# Patient Record
Sex: Male | Born: 1935 | Race: Black or African American | Hispanic: No | Marital: Married | Smoking: Never smoker
Health system: Southern US, Community
[De-identification: ages and names within clinical notes are randomized; demographics above are authoritative.]

## PROBLEM LIST (undated history)

## (undated) DIAGNOSIS — I10 Essential (primary) hypertension: Secondary | ICD-10-CM

## (undated) HISTORY — PX: NECK SURGERY: SHX720

## (undated) HISTORY — PX: RADIOACTIVE SEED IMPLANT: SHX5150

---

## 2007-12-06 ENCOUNTER — Ambulatory Visit (HOSPITAL_COMMUNITY): Admission: RE | Admit: 2007-12-06 | Discharge: 2007-12-06 | Payer: Self-pay | Admitting: Family Medicine

## 2008-06-12 ENCOUNTER — Ambulatory Visit (HOSPITAL_COMMUNITY): Admission: RE | Admit: 2008-06-12 | Discharge: 2008-06-12 | Payer: Self-pay | Admitting: Family Medicine

## 2009-01-19 ENCOUNTER — Encounter: Admission: RE | Admit: 2009-01-19 | Discharge: 2009-01-19 | Payer: Self-pay | Admitting: Neurosurgery

## 2009-03-03 ENCOUNTER — Inpatient Hospital Stay (HOSPITAL_COMMUNITY): Admission: RE | Admit: 2009-03-03 | Discharge: 2009-03-06 | Payer: Self-pay | Admitting: Neurosurgery

## 2010-07-28 LAB — BASIC METABOLIC PANEL
CO2: 28 mEq/L (ref 19–32)
Calcium: 10.6 mg/dL — ABNORMAL HIGH (ref 8.4–10.5)
Creatinine, Ser: 1.28 mg/dL (ref 0.4–1.5)
GFR calc non Af Amer: 55 mL/min — ABNORMAL LOW (ref >60)
Glucose, Bld: 100 mg/dL — ABNORMAL HIGH (ref 70–99)
Sodium: 133 mEq/L — ABNORMAL LOW (ref 135–145)

## 2010-07-28 LAB — CBC
Hemoglobin: 16.5 g/dL (ref 13.0–17.0)
MCHC: 34.9 g/dL (ref 30.0–36.0)
Platelets: 201 10*3/uL (ref 150–400)
RDW: 14.8 % (ref 11.5–15.5)

## 2013-05-06 ENCOUNTER — Ambulatory Visit (HOSPITAL_COMMUNITY): Payer: Self-pay | Admitting: Physical Therapy

## 2013-05-06 ENCOUNTER — Ambulatory Visit (HOSPITAL_COMMUNITY)
Admission: RE | Admit: 2013-05-06 | Discharge: 2013-05-06 | Disposition: A | Discharge status: Home / Self Care | Payer: Non-veteran care | Source: Ambulatory Visit | Attending: Internal Medicine | Admitting: Internal Medicine

## 2013-05-06 DIAGNOSIS — M25512 Pain in left shoulder: Secondary | ICD-10-CM

## 2013-05-06 DIAGNOSIS — M25619 Stiffness of unspecified shoulder, not elsewhere classified: Secondary | ICD-10-CM | POA: Insufficient documentation

## 2013-05-06 DIAGNOSIS — M25519 Pain in unspecified shoulder: Secondary | ICD-10-CM | POA: Insufficient documentation

## 2013-05-06 DIAGNOSIS — IMO0001 Reserved for inherently not codable concepts without codable children: Secondary | ICD-10-CM | POA: Insufficient documentation

## 2013-05-06 DIAGNOSIS — M25612 Stiffness of left shoulder, not elsewhere classified: Secondary | ICD-10-CM

## 2013-05-06 NOTE — Evaluation (Signed)
Physical Therapy Evaluation  Patient Details  Name: Kyle Mccullough MRN: [id number] Date of Birth: 1933-12-08  Today's Date: 05/06/2013 Time: 1025-1059 PT Time Calculation (min): 34 min              Visit#: 1 of 10  Re-eval: 06/05/13 Assessment Diagnosis: Lt shoulder pain Surgical Date: 07/24/12 Next MD Visit: Dr. Namon Mccullough  Authorization: [facility]    Authorization Time Period:   approved Dec 9- March 29th, 10 visits approved  Authorization Visit#: 1 of 10   Past Medical History: No past medical history on file. Past Surgical History: No past surgical history on file.  Subjective Symptoms/Limitations Symptoms: Pt is a 80 year old male referred to PT for Lt shoulder pain which started about 8 months ago.  He reports he laid down one time at night and woke up and had pain to his Lt shoulder.  He has had an MRI and a strained muscle.  He reports he has a hx of neck pain.  At the present time it feels weak and has lost his ROM.  He had a cortisone injection around the last of November and reports his pain is 100% better since then. He reports it feels stiff.  He cannot wash his back, reaching up in the cabinets is difficul, reaching to the other side of his head.  Patient Stated Goals: Decrease pain and improve ROM.  Pain Assessment Currently in Pain?: Yes Pain Location: Shoulder Pain Orientation: Left Pain Type: Chronic pain Pain Onset: More than a month ago Pain Frequency: Intermittent  Balance Screening Balance Screen Has the patient fallen in the past 6 months: No Has the patient had a decrease in activity level because of a fear of falling? : No Is the patient reluctant to leave their home because of a fear of falling? : No  Prior Functioning  Prior Function Vocation: Retired Comments: He enjoys fishing, building bird houses, bird watching, working in the yard.  Sensation/Coordination/Flexibility/Functional Tests Functional Tests Functional Tests: FOTO:  76/24  Assessment LUE AROM (degrees) LUE Overall AROM Comments: supine position Left Shoulder Flexion: 130 Degrees Left Shoulder ABduction: 150 Degrees Left Shoulder Internal Rotation: 75 Degrees Left Shoulder External Rotation: 30 Degrees LUE PROM (degrees) LUE Overall PROM Comments: supine position Left Shoulder Flexion: 145 Degrees Left Shoulder ABduction: 155 Degrees Left Shoulder Internal Rotation: 80 Degrees Left Shoulder External Rotation: 33 Degrees LUE Strength Left Shoulder Flexion: 4/5 (painful) Left Shoulder Extension: 4/5 (painful at end range) Left Shoulder ABduction: 4/5 (painful) Left Shoulder Internal Rotation: 4/5 (4/5) Left Shoulder External Rotation: 3+/5 (painful) Left Shoulder Horizontal ABduction: 4/5 (painl) Left Shoulder Horizontal ADduction: 4/5 Left Elbow Flexion: 5/5 Left Elbow Extension: 5/5 Palpation Palpation: pain and tenderness over Lt shoulder AC joint, biceps tendon,  and greater tubricle. Tenderness to suprascapular musculature. moderate fascial restrictions to cervical and Lt scapular region.  Posture: rounded shoulders and forward head.   Physical Therapy Assessment and Plan PT Assessment and Plan Clinical Impression Statement: Pt isia 80 year old male referred to PT for Lt shoulder pain with impairments listed below.   Pt will benefit from skilled therapeutic intervention in order to improve on the following deficits: Pain;Decreased strength;Impaired perceived functional ability;Decreased range of motion;Improper body mechanics;Decreased mobility PT Frequency: Min 2X/week PT Duration:  (5 weeks) PT Treatment/Interventions: Functional mobility training;Therapeutic activities;Therapeutic exercise;Neuromuscular re-education;Patient/family education;Manual techniques PT Plan: approved 10 visits from [place].  Manaual MFR, PROM first f/u with HEP: AAROM motion and isometrics.  Progress to standing wall exercises  and strengthenig.  Progress to prone and  postural exercises.     Goals Home Exercise Program Pt/caregiver will Perform Home Exercise Program: For increased ROM;For increased strengthening;Independently PT Goal: Perform Home Exercise Program - Progress: Goal set today PT Short Term Goals Time to Complete Short Term Goals: 2 weeks PT Short Term Goal 1: Pt will improve his shoulder AROM flexion, abduction and ER by 10 degrees in each direction for greater ease with donning a coat.   PT Short Term Goal 2: Pt will improve shoulder and scapular strength by 1 muscle grade for greater ease with lifting an object into a cabniet.    PT Short Term Goal 3: Pt will present with minimal fascial restrictions to scapular and cervical region to report pain less than 2/10.   PT Long Term Goals Time to Complete Long Term Goals:  (5 weeks) PT Long Term Goal 1: Pt will improve his shoulder [facility] to [facility] in order to reach to the other side of oppisit shoulder to scratch his back.   PT Long Term Goal 2: Pt will improve his FOTO score status to greater than 76% for improve percieved functional ability.   Problem List Patient Active Problem List     Diagnosis  Date Noted   .  Left shoulder pain  05/08/2013   .  Stiffness of left shoulder joint  05/08/2013     PT Plan of Care PT Home Exercise Plan: given  PT Patient Instructions: importance of HEP Consulted and Agree with Plan of Care: Patient  GP Functional Assessment Tool Used: Foto: 76/24 Functional Limitation: Other PT primary Other PT Primary Current Status (Z6109(G8990): At least 20 percent but less than 40 percent impaired, limited or restricted Other PT Primary Goal Status (U0454(G8991): At least 20 percent but less than 40 percent impaired, limited or restricted  Kyle Mccullough, MPT, ATC 05/06/2013, 11:02 AM  Physician Documentation Your signature is required to indicate approval of the treatment plan as stated above.  Please sign and either send electronically or make a copy of this report for your  files and return this physician signed original.   Please mark one 1.__approve of plan  2. ___approve of plan with the following conditions.   ______________________________                                                          _____________________ Physician Signature                                                                                                             Date

## 2013-05-08 ENCOUNTER — Ambulatory Visit (HOSPITAL_COMMUNITY)
Admission: RE | Admit: 2013-05-08 | Discharge: 2013-05-08 | Disposition: A | Discharge status: Home / Self Care | Payer: Non-veteran care | Source: Ambulatory Visit | Attending: *Deleted | Admitting: *Deleted

## 2013-05-08 DIAGNOSIS — M25512 Pain in left shoulder: Secondary | ICD-10-CM | POA: Insufficient documentation

## 2013-05-08 DIAGNOSIS — M25612 Stiffness of left shoulder, not elsewhere classified: Secondary | ICD-10-CM

## 2013-05-08 NOTE — Progress Notes (Signed)
Physical Therapy Treatment Patient Details  Name: Kyle Mccullough MRN: [id number] Date of Birth: [birth year]  Today's Date: 05/08/2013 Time: 4098-1191 PT Time Calculation (min): 42 min Charges: Manual: 1350-1415 TE: 4782-9562 Visit#: 2 of 10  Re-eval: 06/05/13    [facility]ization: [facility]  Authorization Time Period: 10 visits approved from Dec 9- March 29th  Authorization Visit#: 1 of 10   Subjective: Symptoms/Limitations Symptoms: Pt reports that he has started his HEP.  He states he still does not have pain, continues to be limited by is ROM.  Patient Stated Goals: improve ROM.  Pain Assessment Currently in Pain?: No/denies Pain Score: 0-No pain Pain Location: Shoulder Pain Orientation: Left  Precautions/Restrictions     Exercise/Treatments Supine External Rotation: AAROM;Both;15 reps;Weights;Limitations External Rotation Weight (lbs): 2 dowel External Rotation Limitations: 5 sec holds w/PT facilation Flexion: AAROM;Both;15 reps;Weights;Limitations Shoulder Flexion Weight (lbs): 2 dowel rod Flexion Limitations: 5 sec holds Other Supine Exercises: Open/close coat (shoudler ER): Both, 10 reps Sidelying External Rotation: Left;10 reps;Limitations External Rotation Limitations: PT faciliation ABduction: Left;10 reps ROM / Strengthening / Isometric Strengthening   Flexion: 5X5" Extension: 5X5" External Rotation: 5X5" Internal Rotation: 5X5" ABduction: 5X5" ADduction: 5X5"  Manual Therapy Manual Therapy: Joint mobilization Joint Mobilization: Supine: Grade II to Lt shoulder to improve shoulder ER, abduction and flexion.  Grade I-III to Lt [place] and AC joint w/PROM in all directions following Myofascial Release: Supine: to supraclavical region, biceps tendon insertion, upper trapizieus, and scapular region   Physical Therapy Assessment and Plan PT Assessment and Plan Clinical Impression Statement: Added manual techniques with PROM after with significant improvement in  shoulder ER at approximatly 45 degree angle.  Pt was instructed and demonstrated HEP independently. Pt will benefit from skilled therapeutic intervention in order to improve on the following deficits: Pain;Decreased strength;Impaired perceived functional ability;Decreased range of motion;Improper body mechanics;Decreased mobility PT Frequency: Min 2X/week PT Duration:  (5 weeks) PT Treatment/Interventions: Functional mobility training;Therapeutic activities;Therapeutic exercise;Neuromuscular re-education;Patient/family education;Manual techniques PT Plan: approved 10 visits from [place].  Manaual MFR, PROM first f/u with HEP: AAROM motion.  Progress to standing wall exercises and strengthenig.  Progress to prone and postural exercises.     Goals Home Exercise Program Pt/caregiver will Perform Home Exercise Program: For increased ROM;For increased strengthening;Independently PT Goal: Perform Home Exercise Program - Progress: Progressing toward goal PT Short Term Goals Time to Complete Short Term Goals: 2 weeks PT Short Term Goal 1: Pt will improve his shoulder AROM flexion, abduction and ER by 10 degrees in each direction for greater ease with donning a coat.  PT Short Term Goal 1 - Progress: Progressing toward goal PT Short Term Goal 2: Pt will improve shoulder and scapular strength by 1 muscle grade for greater ease with lifting an object into a cabniet.   PT Short Term Goal 2 - Progress: Progressing toward goal PT Short Term Goal 3: Pt will present with minimal fascial restrictions to scapular and cervical region to report pain less than 2/10.  PT Short Term Goal 3 - Progress: Progressing toward goal PT Long Term Goals Time to Complete Long Term Goals:  (5 weeks) PT Long Term Goal 1: Pt will improve his shoulder AROM to [facility] in order to reach to the other side of oppisite shoulder to scratch his back.  PT Long Term Goal 1 - Progress: Progressing toward goal PT Long Term Goal 2: Pt will improve his  FOTO score status to greater than 76% for improve percieved functional ability.  PT Long Term Goal  2 - Progress: Progressing toward goal  Problem List Patient Active Problem List   Diagnosis Date Noted  . Left shoulder pain 05/08/2013  . Stiffness of left shoulder joint 05/08/2013       GP    Treyshawn Muldrew, MPT, ATC 05/08/2013, 2:36 PM

## 2013-05-13 ENCOUNTER — Ambulatory Visit (HOSPITAL_COMMUNITY): Payer: Non-veteran care | Admitting: Physical Therapy

## 2013-05-14 ENCOUNTER — Ambulatory Visit (HOSPITAL_COMMUNITY)
Admission: RE | Admit: 2013-05-14 | Discharge: 2013-05-14 | Disposition: A | Discharge status: Home / Self Care | Payer: Non-veteran care | Source: Ambulatory Visit | Attending: *Deleted | Admitting: *Deleted

## 2013-05-14 NOTE — Progress Notes (Signed)
Physical Therapy Treatment Patient Details  Name: Kyle GiovanniJohn A Mccullough MRN: [id number] Date of Birth: 11/30/1933  Today's Date: 05/14/2013 Time: 1100-1145 PT Time Calculation (min): 45 min  Visit#: 3 of 10  Re-eval: 06/05/13 Authorization: [facility]  Authorization Time Period: 10 visits approved from Dec 9- March 29th  Authorization Visit#: 3 of 10  Charges: therex 1100-1125 (25'), manual 1126-1145 (19')   Subjective: Symptoms/Limitations Symptoms: Pt states his shoulder still hurts, however can tell therapy is helping.   Exercise/Treatments Supine External Rotation: AAROM;Both;15 reps;Weights;Limitations Flexion: AAROM;Both;15 reps;Weights;Limitations Sidelying External Rotation: AAROM;10 reps External Rotation Limitations: PT faciliation ABduction: Left;10 reps Standing Other Standing Exercises: flexion, abduction and ER stretch against wall/doorway 3X30" ROM / Strengthening / Isometric Strengthening UBE (Upper Arm Bike): 6 minutes 3'fwd/3'bkwd    Manual Therapy Manual Therapy: Joint mobilization Joint Mobilization: Supine  Grade II joint mobs for Lt ER, abd, flexion.  Sidelying scapular mobs Myofascial Release: supine:  supraclavical region, bicep tendons insertion, UT and scap region  Physical Therapy Assessment and Plan PT Assessment and Plan Clinical Impression Statement: Continued to focus on manual stretching and techniques to increase ROM and improve mobility.  Pt instructed in self stretch against wall for shoulder flexion, abduction and ER.  Several spasms palpated in upper trap and mid trap regions today. PT Duration:  (5 weeks) PT Plan: [place]  Continue manual therapies to increase ROM then progress to  strengthenig.  Progress to prone and postural exercises.      Problem List Patient Active Problem List   Diagnosis Date Noted  . Left shoulder pain 05/08/2013  . Stiffness of left shoulder joint 05/08/2013      Lurena NidaAmy B Frazier, PTA/CLT 05/14/2013,  12:08 PM

## 2013-05-15 ENCOUNTER — Ambulatory Visit (HOSPITAL_COMMUNITY)
Admission: RE | Admit: 2013-05-15 | Discharge: 2013-05-15 | Disposition: A | Discharge status: Home / Self Care | Payer: Non-veteran care | Source: Ambulatory Visit | Attending: Physical Therapy | Admitting: Physical Therapy

## 2013-05-15 DIAGNOSIS — M25512 Pain in left shoulder: Secondary | ICD-10-CM

## 2013-05-15 DIAGNOSIS — M25612 Stiffness of left shoulder, not elsewhere classified: Secondary | ICD-10-CM

## 2013-05-15 NOTE — Progress Notes (Signed)
Physical Therapy Treatment Patient Details  Name: Kyle Mccullough MRN: [id number] Date of Birth: 11/30/1933  Today's Date: 05/15/2013 Time: 4098-11911652-1730 PT Time Calculation (min): 38 min Charges: TE: [id number] Manual: 1715-1730 Visit#: 4 of 10  Re-eval: 06/05/13    [facility]ization: [facility]  Authorization Time Period: 10 visits approved from Dec 9- March 29th  Authorization Visit#: 4 of 10   Subjective: Symptoms/Limitations Symptoms: "Whatever she did to me last really felt good."   Pain Assessment Currently in Pain?: No/denies  Precautions/Restrictions     Exercise/Treatments Standing Horizontal ABduction: Both;10 reps;Theraband;Limitations Theraband Level (Shoulder Horizontal ABduction): Level 4 (Blue) Horizontal ABduction Limitations: PT facilation for proper motion Other Standing Exercises: flexion, abduction and ER stretch against wall/doorway 3X30" Other Standing Exercises: Elbow Corner Presses 5x10 sec holds ROM / Strengthening / Isometric Strengthening UBE (Upper Arm Bike): 6 minutes 3'fwd/3'bkwd Thumb Tacks: 10 reps w/PT facilation   Stretches Corner Stretch: 3 reps;30 seconds  Manual Therapy Myofascial Release: Seated to Lt supraspinatus and UT region to decrease fascail restrictiosn with friction massage along scapula.  Physical Therapy Assessment and Plan PT Assessment and Plan Clinical Impression Statement: Pt has significant scapular winging with Thumb tack and horizontal abduction exercise and requires PT facilation for proper scapular stabilization. At this time has most difficulty with coordinated movements of the scapula.  PT Duration:  (5 weeks) PT Plan: [place]. Add theraband postural exercises and shoulder ER/IR with Red or green t-band    Goals    Problem List Patient Active Problem List   Diagnosis Date Noted  . Left shoulder pain 05/08/2013  . Stiffness of left shoulder joint 05/08/2013       GP    Lannette Avellino,  MPT 05/15/2013, 5:54 PM

## 2013-05-20 ENCOUNTER — Ambulatory Visit (HOSPITAL_COMMUNITY)
Admission: RE | Admit: 2013-05-20 | Discharge: 2013-05-20 | Disposition: A | Discharge status: Home / Self Care | Payer: Non-veteran care | Source: Ambulatory Visit | Attending: *Deleted | Admitting: *Deleted

## 2013-05-20 NOTE — Progress Notes (Signed)
Physical Therapy Treatment Patient Details  Name: Kyle GiovanniJohn A Orsborn MRN: [id number] Date of Birth: 11/30/1933  Today's Date: 05/20/2013 Time: 8657-84691108-1150 PT Time Calculation (min): 42 min Charges: Therex x 62'(9528-413230'(1108-1138) Manual x 8'(1142-1150)  Visit#: 5 of 10  Re-eval: 06/05/13  Authorization: [facility]  Authorization Time Period: 10 visits approved from Dec 9- March 29th  Authorization Visit#: 5 of 10   Subjective: Symptoms/Limitations Symptoms: Pt reports continued HEP compliance. Pain Assessment Pain Score: 5  Pain Location: Shoulder Pain Orientation: Left   Exercise/Treatments Standing Horizontal ABduction: Both;10 reps;Theraband;Limitations Theraband Level (Shoulder Horizontal ABduction): Level 4 (Blue) External Rotation: 10 reps;Left;Theraband Theraband Level (Shoulder External Rotation): Level 3 (Green) Internal Rotation: 10 reps;Left;Theraband Theraband Level (Shoulder Internal Rotation): Level 3 (Green) Other Standing Exercises: flexion, abduction and ER stretch against wall/doorway 3X30" Other Standing Exercises: Elbow Corner Presses 5x10 sec holds ROM / Strengthening / Isometric Strengthening UBE (Upper Arm Bike): 6 minutes 3'fwd/3'bkwd to improve strength and mobility Thumb Tacks: 10 reps Stretches Corner Stretch: 3 reps;30 seconds  Manual Therapy Joint Mobilization: Grade II joint mobs for Lt ER, abd, flexion. Myofascial Release: Seated: to left scapular and shoulder musculature to decrease fascial restrictions and pain.  Physical Therapy Assessment and Plan PT Assessment and Plan Clinical Impression Statement: Pt completes therex with improved form. Pt requires multimodal cueing to avoid trunk rotation with internal and external rotation with theraband. Manual techniques completed to left shoulder to improve mobility and decrease pain. PT Duration:  (5 weeks) PT Plan: Continue to progress left shoulder motion, strength and stability.     Problem List Patient Active  Problem List   Diagnosis Date Noted  . Left shoulder pain 05/08/2013  . Stiffness of left shoulder joint 05/08/2013    PT - End of Session Activity Tolerance: Patient tolerated treatment well General Behavior During Therapy: [facility] for tasks assessed/performed  Seth Bakeebekah Laiken Sandy, PTA  05/20/2013, 12:07 PM

## 2013-05-24 ENCOUNTER — Ambulatory Visit (HOSPITAL_COMMUNITY)
Admission: RE | Admit: 2013-05-24 | Discharge: 2013-05-24 | Disposition: A | Discharge status: Home / Self Care | Payer: Non-veteran care | Source: Ambulatory Visit

## 2013-05-24 DIAGNOSIS — M25512 Pain in left shoulder: Secondary | ICD-10-CM

## 2013-05-24 DIAGNOSIS — M25612 Stiffness of left shoulder, not elsewhere classified: Secondary | ICD-10-CM

## 2013-05-24 NOTE — Progress Notes (Signed)
Physical Therapy Treatment Patient Details  Name: Kyle GiovanniJohn A Mccullough MRN: [id number] Date of Birth: 11/30/1933  Today's Date: 05/24/2013 Time: 4098-11911348-1430 PT Time Calculation (min): 42 min Charge :TE [id number], Manual 1412-1430  Visit#: 6 of 10  Re-eval: 06/05/13 Assessment Diagnosis: Lt shoulder pain Surgical Date: 07/24/12 Next MD Visit: Dr. Namon CirriKathleen Mccullough  Authorization: [facility]  Authorization Time Period: 10 visits approved from Dec 9- March 29th  Authorization Visit#: 6 of 10   Subjective: Symptoms/Limitations Symptoms: Pt reports pain continues Lt shoulder pain continues.  Pt reports compliance with HEP daily and reports increase ease with ROM and functional activities like washing hair.  Most difficulty with IR and ER . Pain Assessment Currently in Pain?: Yes Pain Score: 4  Pain Location: Shoulder Pain Orientation: Left  Objective:   Exercise/Treatments Prone  External Rotation: Left;10 reps Internal Rotation: 10 reps;Left Other Prone Exercises: Prone angle wings 10x Sidelying External Rotation: 10 reps;Limitations External Rotation Limitations: PT faciliation Internal Rotation: 10 reps;Limitations Internal Rotation Limitations: PT faciliation Standing External Rotation: 10 reps;Left;Theraband Theraband Level (Shoulder External Rotation): Level 3 (Green) Internal Rotation: 10 reps;Left;Theraband Theraband Level (Shoulder Internal Rotation): Level 3 (Green) ROM / Strengthening / Isometric Strengthening UBE (Upper Arm Bike): 6 minutes 3'fwd/3'bkwd   Manual Therapy Manual Therapy: Joint mobilization Joint Mobilization: Grade II joint mobs for Lt IR and ER, distraction with PROM Myofascial Release: Seated: to left scapular and shoulder musculature to decrease fascial restrictions and pain  Physical Therapy Assessment and Plan PT Assessment and Plan Clinical Impression Statement: Session focus on therex to improve AROM for Lt shoulder internal and external  rotation, added exercises to improve ROM with therapist facilitation required to improve form.  Manual techniques complete to Lt shoulder to reduce fascial restricitons, improve ROM and reduce pain.   PT Plan: Continue to progress left shoulder motion, strength and stability.     Goals Home Exercise Program Pt/caregiver will Perform Home Exercise Program: For increased ROM;For increased strengthening;Independently PT Short Term Goals Time to Complete Short Term Goals: 2 weeks PT Short Term Goal 1: Pt will improve his shoulder AROM flexion, abduction and ER by 10 degrees in each direction for greater ease with donning a coat.  PT Short Term Goal 1 - Progress: Progressing toward goal PT Short Term Goal 2: Pt will improve shoulder and scapular strength by 1 muscle grade for greater ease with lifting an object into a cabniet.   PT Short Term Goal 2 - Progress: Progressing toward goal PT Short Term Goal 3: Pt will present with minimal fascial restrictions to scapular and cervical region to report pain less than 2/10.  PT Short Term Goal 3 - Progress: Progressing toward goal PT Long Term Goals Time to Complete Long Term Goals:  (5 weeks) PT Long Term Goal 1: Pt will improve his shoulder AROM to [facility] in order to reach to the other side of oppisite shoulder to scratch his back.  PT Long Term Goal 1 - Progress: Progressing toward goal PT Long Term Goal 2: Pt will improve his FOTO score status to greater than 76% for improve percieved functional ability.   Problem List Patient Active Problem List   Diagnosis Date Noted  . Left shoulder pain 05/08/2013  . Stiffness of left shoulder joint 05/08/2013    PT - End of Session Activity Tolerance: Patient tolerated treatment well General Behavior During Therapy: [facility] for tasks assessed/performed  GP    Juel BurrowCockerham, [place] Springer Jo 05/24/2013, 6:41 PM

## 2013-05-27 ENCOUNTER — Ambulatory Visit (HOSPITAL_COMMUNITY)
Admission: RE | Admit: 2013-05-27 | Discharge: 2013-05-27 | Disposition: A | Discharge status: Home / Self Care | Payer: Non-veteran care | Source: Ambulatory Visit | Attending: Student | Admitting: Student

## 2013-05-27 DIAGNOSIS — M25619 Stiffness of unspecified shoulder, not elsewhere classified: Secondary | ICD-10-CM | POA: Insufficient documentation

## 2013-05-27 DIAGNOSIS — IMO0001 Reserved for inherently not codable concepts without codable children: Secondary | ICD-10-CM | POA: Insufficient documentation

## 2013-05-27 DIAGNOSIS — M25519 Pain in unspecified shoulder: Secondary | ICD-10-CM | POA: Insufficient documentation

## 2013-05-27 NOTE — Progress Notes (Signed)
Physical Therapy Treatment Patient Details  Name: Kyle GiovanniJohn A Motz MRN: [id number] Date of Birth: 11/30/1933  Today's Date: 05/27/2013 Time: 1015-1100 PT Time Calculation (min): 45 min Visit#: 7 of 10  Re-eval: 06/05/13 [facility]ization: [facility]  Authorization Time Period: 10 visits approved from Dec 9- March 29th  Authorization Visit#: 7 of 10  Charges:  Manual 1015-1035 (20'), therex [id number] (24)  Subjective: Symptoms/Limitations Symptoms: Pt states therapy has helped him alot and his shouder has gotten alot better.  Currently without pain.   Exercise/Treatments Supine Shoulder Flexion Weight (lbs): 10 Standing External Rotation: 15 reps Theraband Level (Shoulder External Rotation): Level 3 (Green) Internal Rotation: 15 reps Theraband Level (Shoulder Internal Rotation): Level 3 (Green) Extension: 10 reps;Theraband Theraband Level (Shoulder Extension): Level 3 (Green) Row: 10 reps;Theraband Theraband Level (Shoulder Row): Level 3 (Green) Retraction: 10 reps;Theraband Theraband Level (Shoulder Retraction): Level 3 (Green) ROM / Strengthening / Isometric Strengthening UBE (Upper Arm Bike): 6 minutes 3'fwd/3'bkwd     Manual Therapy Manual Therapy: Other (comment) Other Manual Therapy: supine MFR, PROM and joint mobs for Lt shoulder  Physical Therapy Assessment and Plan PT Assessment and Plan Clinical Impression Statement: Focused  on manual techniques to decrease spasm/restrictions during A/PROM of LT UE.    PT continues to require cues to perform theraband exercises correctly.  Pt is progressing well with overall pain reduction. PT Plan: Continue to progress left shoulder motion, strength and stability.      Problem List Patient Active Problem List   Diagnosis Date Noted  . Left shoulder pain 05/08/2013  . Stiffness of left shoulder joint 05/08/2013    PT - End of Session Activity Tolerance: Patient tolerated treatment well General Behavior During Therapy: [facility] for tasks  assessed/performed   Lurena NidaAmy B Frazier, PTA/CLT 05/27/2013, 4:55 PM

## 2013-05-31 ENCOUNTER — Ambulatory Visit (HOSPITAL_COMMUNITY)
Admission: RE | Admit: 2013-05-31 | Discharge: 2013-05-31 | Disposition: A | Discharge status: Home / Self Care | Payer: Non-veteran care | Source: Ambulatory Visit | Attending: *Deleted | Admitting: *Deleted

## 2013-05-31 DIAGNOSIS — M25612 Stiffness of left shoulder, not elsewhere classified: Secondary | ICD-10-CM

## 2013-05-31 DIAGNOSIS — M25512 Pain in left shoulder: Secondary | ICD-10-CM

## 2013-05-31 NOTE — Progress Notes (Signed)
Physical Therapy Treatment Patient Details  Name: Kyle Mccullough MRN: [id number] Date of Birth: 11/30/1933  Today's Date: 05/31/2013 Time: 1307-1400 PT Time Calculation (min): 53 min Charge TE [id number], Manual 1345-1400  Visit#: 8 of 10  Re-eval: 06/05/13 Assessment Diagnosis: Lt shoulder pain Surgical Date: 07/24/12 Next MD Visit: Dr. Namon CirriKathleen Broderick-Forsgren- March  [facility]  Authorization Time Period: 10 visits approved from Dec 9- March 29th  Authorization Visit#: 8 of 10   Subjective: Symptoms/Limitations Symptoms: Pt stated he is doing good, no current pain Pain Assessment Currently in Pain?: No/denies  Objective:   Exercise/Treatments Standing External Rotation: 15 reps Theraband Level (Shoulder External Rotation): Level 3 (Green) Internal Rotation: 15 reps Theraband Level (Shoulder Internal Rotation): Level 3 (Green) Extension: 15 reps;Theraband Theraband Level (Shoulder Extension): Level 3 (Green) Row: 15 reps;Theraband Theraband Level (Shoulder Row): Level 3 (Green) Retraction: 15 reps;Theraband Theraband Level (Shoulder Retraction): Level 3 (Green) Therapy Ball Flexion: 10 reps ABduction: 10 reps Right/Left: 5 reps;Limitations Right/Left Limitations: IR/ ER Lt UE only ROM / Strengthening / Isometric Strengthening UBE (Upper Arm Bike): 6 minutes 3'fwd/3'bkwd      Manual Therapy Manual Therapy: Joint mobilization Myofascial Release: Supine Lt shoulder and scapular joint mobs, PROM with distraction  Physical Therapy Assessment and Plan PT Assessment and Plan Clinical Impression Statement: Session focus on improving A/PROM with Lt UE.  Added therapeutic ball activities to improve ROM.  Pt continues to require cues for proper form with theraband exercises, pt c/o dizzy episodes during standing activties today, BP 134/82 mmHg.  Ended session with manual techniques including glenohumeral and scapular mobs as well as PROM.   PT Plan: Continue to  progress left shoulder motion, strength and stability.     Goals Home Exercise Program Pt/caregiver will Perform Home Exercise Program: For increased ROM;For increased strengthening;Independently PT Short Term Goals Time to Complete Short Term Goals: 2 weeks PT Short Term Goal 1: Pt will improve his shoulder AROM flexion, abduction and ER by 10 degrees in each direction for greater ease with donning a coat.  PT Short Term Goal 1 - Progress: Progressing toward goal PT Short Term Goal 2: Pt will improve shoulder and scapular strength by 1 muscle grade for greater ease with lifting an object into a cabniet.   PT Short Term Goal 2 - Progress: Progressing toward goal PT Short Term Goal 3: Pt will present with minimal fascial restrictions to scapular and cervical region to report pain less than 2/10.  PT Short Term Goal 3 - Progress: Progressing toward goal PT Long Term Goals Time to Complete Long Term Goals:  (5 weeks) PT Long Term Goal 1: Pt will improve his shoulder [facility]OM to [facility] in order to reach to the other side of oppisite shoulder to scratch his back.  PT Long Term Goal 1 - Progress: Progressing toward goal PT Long Term Goal 2: Pt will improve his FOTO score status to greater than 76% for improve percieved functional ability.   Problem List Patient Active Problem List   Diagnosis Date Noted  . Left shoulder pain 05/08/2013  . Stiffness of left shoulder joint 05/08/2013    PT - End of Session Activity Tolerance: Patient tolerated treatment well General Behavior During Therapy: [facility] for tasks assessed/performed  GP    Juel BurrowCockerham, Bader Stubblefield Jo 05/31/2013, 2:10 PM

## 2013-06-05 ENCOUNTER — Ambulatory Visit (HOSPITAL_COMMUNITY)
Admission: RE | Admit: 2013-06-05 | Discharge: 2013-06-05 | Disposition: A | Discharge status: Home / Self Care | Payer: Non-veteran care | Source: Ambulatory Visit | Attending: *Deleted | Admitting: *Deleted

## 2013-06-05 DIAGNOSIS — M25612 Stiffness of left shoulder, not elsewhere classified: Secondary | ICD-10-CM

## 2013-06-05 DIAGNOSIS — M25512 Pain in left shoulder: Secondary | ICD-10-CM

## 2013-06-05 NOTE — Progress Notes (Signed)
Physical Therapy Treatment Patient Details  Name: Kyle Mccullough MRN: [id number] Date of Birth: 11/30/1933  Today's Date: 06/05/2013 Time: 1515-1600 PT Time Calculation (min): 45 min 1515 - 0100 TE  1545 -1600 manual  Visit#: 9 of 10  Re-eval: 06/05/13 Assessment Diagnosis: Lt shoulder pain Surgical Date: 07/24/12 Next MD Visit: Dr. Doyce Para- March  [facility]  Authorization Time Period:    Authorization Visit#: 9 of 10   Subjective: Symptoms/Limitations Patient Stated Goals: improve ROM.  Pain Assessment Currently in Pain?: No/denies  Precautions/Restrictions : dizzy supine to sit past 2 visits      Exercise/Treatments    Supine Protraction/retraction : AROM;Left;10 reps, 2 sets    Sidelying External Rotation: AROM;Left;Strengthening;20 reps ( 2 sets of 10) cueing  Standing Horizontal ABduction: Both;10 reps;Theraband;Limitations Theraband Level (Shoulder Horizontal ABduction): Level 4 (Blue) Horizontal ABduction Limitations: PT facilation for proper motion External Rotation: 15 reps Theraband Level (Shoulder External Rotation): Level 3 (Green) Internal Rotation: 15 reps Theraband Level (Shoulder Internal Rotation): Level 3 (Green) Extension: 15 reps;Theraband Theraband Level (Shoulder Extension): Level 3 (Green) Row: 15 reps;Theraband Theraband Level (Shoulder Row): Level 3 (Green) Retraction: 15 reps;Theraband Theraband Level (Shoulder Retraction): Level 3 (Green) Other Standing Exercises: standing wall slides 2 x 10     ROM / Strengthening / Isometric Strengthening UBE (Upper Arm Bike): 6 minutes 3'fwd/3'bkwd    Manual Therapy Manual Therapy: Joint mobilization Joint Mobilization: grade 2 mobs all planes, PROM all planes, distraction  left shoulder x 15 min   Physical Therapy Assessment and Plan PT Assessment and Plan Clinical Impression Statement: patient reports significant improvement in use and ROM of left arm. his AROM is  WFL, with flexion to 150, able to reach behind head and able to reach into back pocket.  dizzy supine to sit in clinic. sat at table edge until not dizzy . Standing Internal rotation L1  Level behind back  PT Plan: Continue to progress left shoulder motion, strength and stability.    discharge next visit      Goals PT Short Term Goals Time to Complete Short Term Goals: 2 weeks PT Short Term Goal 1: Pt will improve his shoulder AROM flexion, abduction and ER by 10 degrees in each direction for greater ease with donning a coat.  PT Short Term Goal 1 - Progress: Met PT Short Term Goal 2: Pt will improve shoulder and scapular strength by 1 muscle grade for greater ease with lifting an object into a cabniet.   PT Short Term Goal 2 - Progress: Progressing toward goal PT Short Term Goal 3: Pt will present with minimal fascial restrictions to scapular and cervical region to report pain less than 2/10.  PT Short Term Goal 3 - Progress: Met PT Long Term Goals PT Long Term Goal 1: Pt will improve his shoulder AROM to [facility] in order to reach to the other side of oppisite shoulder to scratch his back.  PT Long Term Goal 1 - Progress: Met PT Long Term Goal 2: Pt will improve his FOTO score status to greater than 76% for improve percieved functional ability.  PT Long Term Goal 2 - Progress: Not met  Problem List Patient Active Problem List   Diagnosis Date Noted  . Left shoulder pain 05/08/2013  . Stiffness of left shoulder joint 05/08/2013       GP Functional Assessment Tool Used: FOTO  56/44  Other PT Primary Current Status (F1219): At least 40 percent but less than 60 percent impaired, limited  or restricted Other PT Primary Goal Status [phone]): At least 20 percent but less than 40 percent impaired, limited or restricted  Kyle Mccullough 06/05/2013, 5:06 PM

## 2013-06-07 ENCOUNTER — Ambulatory Visit (HOSPITAL_COMMUNITY): Payer: Non-veteran care | Admitting: Physical Therapy

## 2013-06-13 ENCOUNTER — Ambulatory Visit (HOSPITAL_COMMUNITY)
Admission: RE | Admit: 2013-06-13 | Discharge: 2013-06-13 | Disposition: A | Discharge status: Home / Self Care | Payer: Non-veteran care | Source: Ambulatory Visit | Attending: *Deleted | Admitting: *Deleted

## 2013-06-13 DIAGNOSIS — M25512 Pain in left shoulder: Secondary | ICD-10-CM

## 2013-06-13 DIAGNOSIS — M25612 Stiffness of left shoulder, not elsewhere classified: Secondary | ICD-10-CM

## 2013-06-13 NOTE — Evaluation (Signed)
Physical Therapy Discharge Summary   Patient Details  Name: ISABELLA IDA MRN: [id number] Date of Birth: 11/30/1933  Today's Date: 06/13/2013 Time: 1100-1150 PT Time Calculation (min): 50 min  TE 1100- 1150            Visit#: 10 of 10  Re-ev al:   Assessment Diagnosis: Lt shoulder pain Next MD Visit: Dr. Doyce Para- March  [facility]    Authorization Time Period: 10 visits approved from Dec 9- March 29th  Authorization Visit#: 10 of 10   Subjective Symptoms: Pt stated he is doing well, no current pain Currently in Pain?: No/denies, initially high pain levels    Assessment LUE AROM (degrees) Left Shoulder Flexion: 155 Degrees Left Shoulder ABduction: 155 Degrees Left Shoulder External Rotation: 55 Degrees Standing Int Rotation L1 level  LUE PROM (degrees) Left Shoulder Flexion: 160 Degrees Left Shoulder ABduction: 160 Degrees Left Shoulder External Rotation: 60 Degrees LUE Strength Left Shoulder Flexion: 4+/5 Left Shoulder Extension: 4+ /5 Left Shoulder External Rotation: 3+/5  Exercise/Treatments Standing Theraband Level (Shoulder Horizontal ABduction): Level 4 (Blue) Horizontal ABduction Limitations: PT facilation for proper motion External Rotation: 20 reps Theraband Level (Shoulder External Rotation): Level 3 (Green) Internal Rotation: 20 reps Theraband Level (Shoulder Internal Rotation): Level 3 (Green) Extension: 15 reps;Theraband Theraband Level (Shoulder Extension): Level 3 (Green) Row: 15 reps;Theraband Theraband Level (Shoulder Row): Level 3 (Green) Retraction: 15 reps;Theraband Theraband Level (Shoulder Retraction): Level 3 (Green) Other Standing Exercises: standing wand int rotation 2 x 10 , standing wand ext rotation 2 x 10  Other Standing Exercises: standing wall slides 2 x 10  ROM / Strengthening / Isometric Strengthening UBE (Upper Arm Bike): 6 minutes 3'fwd/3'bkwd  written HEP for t band exercises , wall slides, wand  standing Internal rotations and external rotation , blue t band provided , reviewed HEP regarding form   Physical Therapy Assessment and Plan PT Assessment and Plan Clinical Impression Statement: patient has restored functional AROM and strength in L UE. He dioes  have strength and ROM deficits in exteranl rotations. He is pleased with his progress and has a HEP.  PT Plan: discharge and continue HEP     Goals Home Exercise Program Pt/caregiver will Perform Home Exercise Program: For increased ROM;For increased strengthening;Independently PT Goal: Perform Home Exercise Program - Progress: Met PT Short Term Goals Time to Complete Short Term Goals: 2 weeks PT Short Term Goal 1: Pt will improve his shoulder AROM flexion, abduction and ER by 10 degrees in each direction for greater ease with donning a coat.  PT Short Term Goal 1 - Progress: Met PT Short Term Goal 2: Pt will improve shoulder and scapular strength by 1 muscle grade for greater ease with lifting an object into a cabniet.   PT Short Term Goal 2 - Progress: Partly met PT Short Term Goal 3: Pt will present with minimal fascial restrictions to scapular and cervical region to report pain less than 2/10.  PT Short Term Goal 3 - Progress: Met PT Long Term Goals PT Long Term Goal 1: Pt will improve his shoulder AROM to [facility] in order to reach to the other side of oppisite shoulder to scratch his back.  PT Long Term Goal 1 - Progress: Met PT Long Term Goal 2: Pt will improve his FOTO score status to greater than 76% for improve percieved functional ability.  PT Long Term Goal 2 - Progress: Not met  Problem List Patient Active Problem List   Diagnosis Date Noted  .  Left shoulder pain 05/08/2013  . Stiffness of left shoulder joint 05/08/2013    PT - End of Session Activity Tolerance: Patient tolerated treatment well  GP Functional Assessment Tool Used: FOTO 56/44  Functional Limitation: Other PT primary Other PT Primary Goal Status  (C9167): At least 20 percent but less than 40 percent impaired, limited or restricted Other PT Primary Discharge Status [phone]): At least 40 percent but less than 60 percent impaired, limited or restricted  Leitha Hyppolite 06/13/2013, 12:53 PM  Physician Documentation Your signature is required to indicate approval of the treatment plan as stated above.  Please sign and either send electronically or make a copy of this report for your files and return this physician signed original.   Please mark one 1.__approve of plan  2. ___approve of plan with the following conditions.   ______________________________                                                          _____________________ Physician Signature                                                                                                             Date

## 2013-08-29 ENCOUNTER — Emergency Department (HOSPITAL_COMMUNITY): Payer: Medicare Other

## 2013-08-29 ENCOUNTER — Encounter (HOSPITAL_COMMUNITY): Payer: Self-pay | Admitting: Emergency Medicine

## 2013-08-29 ENCOUNTER — Emergency Department (HOSPITAL_COMMUNITY)
Admission: EM | Admit: 2013-08-29 | Discharge: 2013-08-30 | Disposition: A | Discharge status: Home / Self Care | Payer: Medicare Other | Attending: Emergency Medicine | Admitting: Emergency Medicine

## 2013-08-29 DIAGNOSIS — I10 Essential (primary) hypertension: Secondary | ICD-10-CM | POA: Diagnosis not present

## 2013-08-29 DIAGNOSIS — R42 Dizziness and giddiness: Secondary | ICD-10-CM | POA: Diagnosis not present

## 2013-08-29 DIAGNOSIS — J189 Pneumonia, unspecified organism: Secondary | ICD-10-CM

## 2013-08-29 DIAGNOSIS — R509 Fever, unspecified: Secondary | ICD-10-CM | POA: Diagnosis present

## 2013-08-29 DIAGNOSIS — J159 Unspecified bacterial pneumonia: Secondary | ICD-10-CM | POA: Diagnosis not present

## 2013-08-29 DIAGNOSIS — R61 Generalized hyperhidrosis: Secondary | ICD-10-CM | POA: Insufficient documentation

## 2013-08-29 HISTORY — DX: Essential (primary) hypertension: I10

## 2013-08-29 MED ORDER — ACETAMINOPHEN 325 MG PO TABS
650.0000 mg | ORAL_TABLET | Freq: Once | ORAL | Status: AC
  Administered 2013-08-30: 650 mg via ORAL
  Filled 2013-08-29: qty 2

## 2013-08-29 NOTE — ED Notes (Signed)
Patient reports productive cough, chills, and fever x approximately a week.

## 2013-08-29 NOTE — ED Provider Notes (Signed)
CSN: [id number]     Arrival date & time 08/29/13  2100 History  This chart was scribed for Kyle Boozeavid Summar Mcglothlin, MD by Kyle Mccullough, ED Scribe. This patient was seen in room APA19/APA19 and the patient's care was started at 11:37 PM.    Chief Complaint  Patient presents with  . Fever  . Cough   The history is provided by the patient. No language interpreter was used.   HPI Comments: Kyle Mccullough is a 80 y.o. male who presents to the Emergency Department complaining of constant unchanged fever with associated diaphoresis, chills, productive cough with clear green sputum, and lightheadedness onset yesterday. [facility] He did not take his temperature (temp in ER is 102.3). He denies SOB, pain anywhere. He has not taken any OTC medications. He is otherwise healthy. He does not smoke.    Past Medical History  Diagnosis Date  . Hypertension    Past Surgical History  Procedure Laterality Date  . Neck surgery     History reviewed. No pertinent family history. History  Substance Use Topics  . Smoking status: Never Smoker   . Smokeless tobacco: Not on file  . Alcohol Use: No    Review of Systems  Constitutional: Positive for fever, chills and diaphoresis.  Respiratory: Positive for cough.   Neurological: Positive for light-headedness.  All other systems reviewed and are negative.     Allergies  Review of patient's allergies indicates no known allergies.  Home Medications   Prior to Admission medications   Not on File   BP 142/86  Pulse 99  Temp(Src) 102.3 F (39.1 C) (Oral)  Resp 18  Ht 5\' 10"  (1.778 m)  Wt 167 lb (75.751 kg)  BMI 23.96 kg/m2  SpO2 94% Physical Exam  Nursing note and vitals reviewed. Constitutional: He is oriented to person, place, and time. He appears well-developed and well-nourished. No distress.  HENT:  Head: Normocephalic and atraumatic.  Eyes: EOM are normal. Pupils are equal, round, and reactive to light.   Neck: Neck supple. No JVD present. No tracheal deviation present.  Cardiovascular: Normal rate, regular rhythm and normal heart sounds.   No murmur heard. Pulmonary/Chest: Effort normal and breath sounds normal. No respiratory distress. He has no wheezes.  Rales at both bases much more prominent on the right  Abdominal: Soft. Bowel sounds are normal. He exhibits no mass. There is no tenderness.  Musculoskeletal: Normal range of motion. He exhibits no edema.  Lymphadenopathy:    He has no cervical adenopathy.  Neurological: He is alert and oriented to person, place, and time. He has normal reflexes. No cranial nerve deficit. Coordination normal.  Skin: Skin is warm and dry. No rash noted.  Psychiatric: He has a normal mood and affect. His behavior is normal. Thought content normal.    ED Course  Procedures (including critical care time) Medications - No data to display  DIAGNOSTIC STUDIES: Oxygen Saturation is 94% on RA, adequate by my interpretation.    COORDINATION OF CARE: 11:44 PM- Discussed treatment plan with pt. Pt agrees to plan.    Labs Review Results for orders placed during the hospital encounter of 08/29/13  CBC WITH DIFFERENTIAL      Result Value Ref Range   WBC 10.0  4.0 - 10.5 K/uL   RBC 5.18  4.22 - 5.81 MIL/uL   Hemoglobin 14.4  13.0 - 17.0 g/dL   HCT 40.940.1  81.139.0 - 91.452.0 %  MCV 77.4 (*) 78.0 - 100.0 fL   MCH 27.8  26.0 - 34.0 pg   MCHC 35.9  30.0 - 36.0 g/dL   RDW 40.914.2  81.111.5 - 91.415.5 %   Platelets 209  150 - 400 K/uL   Neutrophils Relative % 82 (*) 43 - 77 %   Neutro Abs 8.3 (*) 1.7 - 7.7 K/uL   Lymphocytes Relative 11 (*) 12 - 46 %   Lymphs Abs 1.1  0.7 - 4.0 K/uL   Monocytes Relative 7  3 - 12 %   Monocytes Absolute 0.7  0.1 - 1.0 K/uL   Eosinophils Relative 0  0 - 5 %   Eosinophils Absolute 0.0  0.0 - 0.7 K/uL   Basophils Relative 0  0 - 1 %   Basophils Absolute 0.0  0.0 - 0.1 K/uL  BASIC METABOLIC PANEL      Result Value Ref Range   Sodium 135 (*)  137 - 147 mEq/L   Potassium 3.8  3.7 - 5.3 mEq/L   Chloride 98  96 - 112 mEq/L   CO2 24  19 - 32 mEq/L   Glucose, Bld 125 (*) 70 - 99 mg/dL   BUN 9  6 - 23 mg/dL   Creatinine, Ser 7.821.09  0.50 - 1.35 mg/dL   Calcium 95.610.4  8.4 - 21.310.5 mg/dL   GFR calc non Af Amer 63 (*) >90 mL/min   GFR calc Af Amer 74 (*) >90 mL/min  LACTIC ACID, PLASMA      Result Value Ref Range   Lactic Acid, Venous 1.0  0.5 - 2.2 mmol/L   Imaging Review Dg Chest 2 View  08/30/2013   CLINICAL DATA:  Cough for 3 days, fever tonight  EXAM: CHEST  2 VIEW  COMPARISON:  DG CHEST 2 VIEW dated 03/03/2009  FINDINGS: There is left lower lobe airspace disease concerning for pneumonia. There is no pleural effusion or pneumothorax. The heart and mediastinal contours are unremarkable.  The osseous structures are unremarkable.  IMPRESSION: Left lower lobe pneumonia.   Electronically Signed   By: Elige KoHetal  Patel   On: 08/30/2013 00:54   Images viewed by me.  MDM   Final diagnoses:  None    Cough and fever with rales strongly suggestive of pneumonia. Patient does not appear toxic and he is maintaining good oxygen saturation. Chest x-ray or be obtained to rule out pneumonia and screening labs obtained.  Chest x-ray does confirm a left lower lobe pneumonia and he started on antibiotics of ceftriaxone and azithromycin. Lactic acid has come back normal and WBC is normal although there is a slight left shift. Overall, his clinical presentation suggest that he would do well as an outpatient and he is discharged with prescription for amoxicillin. He is to return should symptoms worsen.  I personally performed the services described in this documentation, which was scribed in my presence. The recorded information has been reviewed and is accurate.     Kyle Boozeavid Rozanne Heumann, MD 08/30/13 [phone]

## 2013-08-30 DIAGNOSIS — J159 Unspecified bacterial pneumonia: Secondary | ICD-10-CM | POA: Diagnosis not present

## 2013-08-30 LAB — CBC WITH DIFFERENTIAL/PLATELET
BASOS ABS: 0 10*3/uL (ref 0.0–0.1)
BASOS PCT: 0 % (ref 0–1)
EOS PCT: 0 % (ref 0–5)
Eosinophils Absolute: 0 10*3/uL (ref 0.0–0.7)
HEMATOCRIT: 40.1 % (ref 39.0–52.0)
Hemoglobin: 14.4 g/dL (ref 13.0–17.0)
Lymphocytes Relative: 11 % — ABNORMAL LOW (ref 12–46)
Lymphs Abs: 1.1 10*3/uL (ref 0.7–4.0)
MCH: 27.8 pg (ref 26.0–34.0)
MCHC: 35.9 g/dL (ref 30.0–36.0)
MCV: 77.4 fL — AB (ref 78.0–100.0)
MONO ABS: 0.7 10*3/uL (ref 0.1–1.0)
Monocytes Relative: 7 % (ref 3–12)
Neutro Abs: 8.3 10*3/uL — ABNORMAL HIGH (ref 1.7–7.7)
Neutrophils Relative %: 82 % — ABNORMAL HIGH (ref 43–77)
Platelets: 209 10*3/uL (ref 150–400)
RBC: 5.18 MIL/uL (ref 4.22–5.81)
RDW: 14.2 % (ref 11.5–15.5)
WBC: 10 10*3/uL (ref 4.0–10.5)

## 2013-08-30 LAB — BASIC METABOLIC PANEL
BUN: 9 mg/dL (ref 6–23)
CALCIUM: 10.4 mg/dL (ref 8.4–10.5)
CO2: 24 meq/L (ref 19–32)
CREATININE: 1.09 mg/dL (ref 0.50–1.35)
Chloride: 98 mEq/L (ref 96–112)
GFR calc non Af Amer: 63 mL/min — ABNORMAL LOW (ref >90)
GFR, EST AFRICAN AMERICAN: 74 mL/min — AB (ref >90)
Glucose, Bld: 125 mg/dL — ABNORMAL HIGH (ref 70–99)
Potassium: 3.8 mEq/L (ref 3.7–5.3)
Sodium: 135 mEq/L — ABNORMAL LOW (ref 137–147)

## 2013-08-30 LAB — LACTIC ACID, PLASMA: LACTIC ACID, VENOUS: 1 mmol/L (ref 0.5–2.2)

## 2013-08-30 MED ORDER — AMOXICILLIN 500 MG PO CAPS: 1000.0000 mg | ORAL_CAPSULE | Freq: Three times a day (TID) | ORAL | Status: AC

## 2013-08-30 MED ORDER — AZITHROMYCIN 500 MG IV SOLR
500.0000 mg | Freq: Once | INTRAVENOUS | Status: AC
  Administered 2013-08-30: 500 mg via INTRAVENOUS

## 2013-08-30 MED ORDER — DEXTROSE 5 % IV SOLN
1.0000 g | Freq: Once | INTRAVENOUS | Status: AC
  Administered 2013-08-30: 1 g via INTRAVENOUS
  Filled 2013-08-30: qty 10

## 2013-08-30 NOTE — ED Notes (Signed)
Discharge instructions given and reviewed with patient's wife.  Prescription given for Amoxil; effects and use explained.  Wife verbalized understanding to complete all antibiotic and to follow up with PMD in 10 days for recheck.  Patient discharged home in good condition via wheelchair in wife's care.

## 2013-08-30 NOTE — Discharge Instructions (Signed)
Pneumonia, Adult °Pneumonia is an infection of the lungs.  °CAUSES °Pneumonia may be caused by bacteria or a virus. Usually, these infections are caused by breathing infectious particles into the lungs (respiratory tract). °SYMPTOMS  °· Cough. °· Fever. °· Chest pain. °· Increased rate of breathing. °· Wheezing. °· Mucus production. °DIAGNOSIS  °If you have the common symptoms of pneumonia, your caregiver will typically confirm the diagnosis with a chest X-ray. The X-ray will show an abnormality in the lung (pulmonary infiltrate) if you have pneumonia. Other tests of your blood, urine, or sputum may be done to find the specific cause of your pneumonia. Your caregiver may also do tests (blood gases or pulse oximetry) to see how well your lungs are working. °TREATMENT  °Some forms of pneumonia may be spread to other people when you cough or sneeze. You may be asked to wear a mask before and during your exam. Pneumonia that is caused by bacteria is treated with antibiotic medicine. Pneumonia that is caused by the influenza virus may be treated with an antiviral medicine. Most other viral infections must run their course. These infections will not respond to antibiotics.  °PREVENTION °A pneumococcal shot (vaccine) is available to prevent a common bacterial cause of pneumonia. This is usually suggested for: °· People over 65 years old. °· Patients on chemotherapy. °· People with chronic lung problems, such as bronchitis or emphysema. °· People with immune system problems. °If you are over 65 or have a high risk condition, you may receive the pneumococcal vaccine if you have not received it before. In some countries, a routine influenza vaccine is also recommended. This vaccine can help prevent some cases of pneumonia. You may be offered the influenza vaccine as part of your care. °If you smoke, it is time to quit. You may receive instructions on how to stop smoking. Your caregiver can provide medicines and counseling to  help you quit. °HOME CARE INSTRUCTIONS  °· Cough suppressants may be used if you are losing too much rest. However, coughing protects you by clearing your lungs. You should avoid using cough suppressants if you can. °· Your caregiver may have prescribed medicine if he or she thinks your pneumonia is caused by a bacteria or influenza. Finish your medicine even if you start to feel better. °· Your caregiver may also prescribe an expectorant. This loosens the mucus to be coughed up. °· Only take over-the-counter or prescription medicines for pain, discomfort, or fever as directed by your caregiver. °· Do not smoke. Smoking is a common cause of bronchitis and can contribute to pneumonia. If you are a smoker and continue to smoke, your cough may last several weeks after your pneumonia has cleared. °· A cold steam vaporizer or humidifier in your room or home may help loosen mucus. °· Coughing is often worse at night. Sleeping in a semi-upright position in a recliner or using a couple pillows under your head will help with this. °· Get rest as you feel it is needed. Your body will usually let you know when you need to rest. °SEEK IMMEDIATE MEDICAL CARE IF:  °· Your illness becomes worse. This is especially true if you are elderly or weakened from any other disease. °· You cannot control your cough with suppressants and are losing sleep. °· You begin coughing up blood. °· You develop pain which is getting worse or is uncontrolled with medicines. °· You have a fever. °· Any of the symptoms which initially brought you in for treatment   are getting worse rather than better. °· You develop shortness of breath or chest pain. °MAKE SURE YOU:  °· Understand these instructions. °· Will watch your condition. °· Will get help right away if you are not doing well or get worse. °Document Released: 04/11/2005 Document Revised: 07/04/2011 Document Reviewed: 07/01/2010 °ExitCare® Patient Information ©2014 [employer]. ° °Amoxicillin  capsules or tablets °What is this medicine? °AMOXICILLIN (a mox i SIL in) is a penicillin antibiotic. It is used to treat certain kinds of bacterial infections. It will not work for colds, flu, or other viral infections. °This medicine may be used for other purposes; ask your health care provider or pharmacist if you have questions. °COMMON BRAND NAME(S): Amoxil, Moxilin , Sumox, Trimox °What should I tell my health care provider before I take this medicine? °They need to know if you have any of these conditions: °-asthma °-kidney disease °-an unusual or allergic reaction to amoxicillin, other penicillins, cephalosporin antibiotics, other medicines, foods, dyes, or preservatives °-pregnant or trying to get pregnant °-breast-feeding °How should I use this medicine? °Take this medicine by mouth with a glass of water. Follow the directions on your prescription label. You may take this medicine with food or on an empty stomach. Take your medicine at regular intervals. Do not take your medicine more often than directed. Take all of your medicine as directed even if you think your are better. Do not skip doses or stop your medicine early. °Talk to your pediatrician regarding the use of this medicine in children. While this drug may be prescribed for selected conditions, precautions do apply. °Overdosage: If you think you have taken too much of this medicine contact a poison control center or emergency room at once. °NOTE: This medicine is only for you. Do not share this medicine with others. °What if I miss a dose? °If you miss a dose, take it as soon as you can. If it is almost time for your next dose, take only that dose. Do not take double or extra doses. °What may interact with this medicine? °-amiloride °-birth control pills °-chloramphenicol °-macrolides °-probenecid °-sulfonamides °-tetracyclines °This list may not describe all possible interactions. Give your health care provider a list of all the medicines,  herbs, non-prescription drugs, or dietary supplements you use. Also tell them if you smoke, drink alcohol, or use illegal drugs. Some items may interact with your medicine. °What should I watch for while using this medicine? °Tell your doctor or health care professional if your symptoms do not improve in 2 or 3 days. Take all of the doses of your medicine as directed. Do not skip doses or stop your medicine early. °If you are diabetic, you may get a false positive result for sugar in your urine with certain brands of urine tests. Check with your doctor. °Do not treat diarrhea with over-the-counter products. Contact your doctor if you have diarrhea that lasts more than 2 days or if the diarrhea is severe and watery. °What side effects may I notice from receiving this medicine? °Side effects that you should report to your doctor or health care professional as soon as possible: °-allergic reactions like skin rash, itching or hives, swelling of the face, lips, or tongue °-breathing problems °-dark urine °-redness, blistering, peeling or loosening of the skin, including inside the mouth °-seizures °-severe or watery diarrhea °-trouble passing urine or change in the amount of urine °-unusual bleeding or bruising °-unusually weak or tired °-yellowing of the eyes or skin °Side effects that   usually do not require medical attention (report to your doctor or health care professional if they continue or are bothersome): °-dizziness °-headache °-stomach upset °-trouble sleeping °This list may not describe all possible side effects. Call your doctor for medical advice about side effects. You may report side effects to FDA at 1-800-FDA-1088. °Where should I keep my medicine? °Keep out of the reach of children. °Store between 68 and 77 degrees F (20 and 25 degrees C). Keep bottle closed tightly. Throw away any unused medicine after the expiration date. °NOTE: This sheet is a summary. It may not cover all possible information. If  you have questions about this medicine, talk to your doctor, pharmacist, or health care provider. °© 2014, [employer]. (2007-07-03 14:10:59) ° °

## 2017-01-02 ENCOUNTER — Telehealth (HOSPITAL_COMMUNITY): Payer: Self-pay

## 2017-01-02 NOTE — Telephone Encounter (Signed)
01/02/17  pt said that he couldn't come and will call us back if he needs to reschedule

## 2017-01-03 ENCOUNTER — Ambulatory Visit (HOSPITAL_COMMUNITY): Payer: Non-veteran care | Admitting: Physical Therapy

## 2017-01-05 ENCOUNTER — Ambulatory Visit (HOSPITAL_COMMUNITY): Payer: Non-veteran care | Admitting: Physical Therapy

## 2017-01-10 ENCOUNTER — Encounter (HOSPITAL_COMMUNITY): Payer: Non-veteran care | Admitting: Physical Therapy

## 2017-01-12 ENCOUNTER — Encounter (HOSPITAL_COMMUNITY): Payer: Non-veteran care | Admitting: Physical Therapy

## 2017-01-17 ENCOUNTER — Encounter (HOSPITAL_COMMUNITY): Payer: Non-veteran care | Admitting: Physical Therapy

## 2017-01-19 ENCOUNTER — Encounter (HOSPITAL_COMMUNITY): Payer: Non-veteran care

## 2017-01-24 ENCOUNTER — Encounter (HOSPITAL_COMMUNITY): Payer: Non-veteran care | Admitting: Physical Therapy

## 2017-01-26 ENCOUNTER — Encounter (HOSPITAL_COMMUNITY): Payer: Non-veteran care | Admitting: Physical Therapy

## 2017-01-31 ENCOUNTER — Encounter (HOSPITAL_COMMUNITY): Payer: Non-veteran care | Admitting: Physical Therapy

## 2017-02-02 ENCOUNTER — Encounter (HOSPITAL_COMMUNITY): Payer: Non-veteran care | Admitting: Physical Therapy

## 2017-02-07 ENCOUNTER — Encounter (HOSPITAL_COMMUNITY): Payer: Non-veteran care | Admitting: Physical Therapy

## 2017-02-09 ENCOUNTER — Encounter (HOSPITAL_COMMUNITY): Payer: Non-veteran care | Admitting: Physical Therapy

## 2017-02-14 ENCOUNTER — Encounter (HOSPITAL_COMMUNITY): Payer: Non-veteran care | Admitting: Physical Therapy

## 2017-02-16 ENCOUNTER — Encounter (HOSPITAL_COMMUNITY): Payer: Non-veteran care | Admitting: Physical Therapy

## 2017-02-23 ENCOUNTER — Telehealth (HOSPITAL_COMMUNITY): Payer: Self-pay | Admitting: Physical Therapy

## 2017-02-23 NOTE — Telephone Encounter (Signed)
Spoke with Catalino's wife to schedule. She will have him call me back. I have questions about the [facility] referral, what body part needs therapy?

## 2018-03-01 ENCOUNTER — Emergency Department (HOSPITAL_COMMUNITY)
Admission: EM | Admit: 2018-03-01 | Discharge: 2018-03-01 | Disposition: A | Discharge status: Home / Self Care | Payer: Medicare Other | Attending: Emergency Medicine | Admitting: Emergency Medicine

## 2018-03-01 ENCOUNTER — Encounter (HOSPITAL_COMMUNITY): Payer: Self-pay | Admitting: Emergency Medicine

## 2018-03-01 ENCOUNTER — Other Ambulatory Visit: Payer: Self-pay

## 2018-03-01 ENCOUNTER — Emergency Department (HOSPITAL_COMMUNITY): Payer: Medicare Other

## 2018-03-01 DIAGNOSIS — R05 Cough: Secondary | ICD-10-CM | POA: Diagnosis present

## 2018-03-01 DIAGNOSIS — I10 Essential (primary) hypertension: Secondary | ICD-10-CM | POA: Diagnosis not present

## 2018-03-01 DIAGNOSIS — J209 Acute bronchitis, unspecified: Secondary | ICD-10-CM | POA: Insufficient documentation

## 2018-03-01 MED ORDER — AZITHROMYCIN 250 MG PO TABS
500.0000 mg | ORAL_TABLET | Freq: Once | ORAL | Status: AC
  Administered 2018-03-01: 500 mg via ORAL
  Filled 2018-03-01: qty 2

## 2018-03-01 MED ORDER — AEROCHAMBER PLUS FLO-VU MEDIUM MISC
1.0000 | Freq: Once | Status: AC
  Administered 2018-03-01: 1
  Filled 2018-03-01 (×2): qty 1

## 2018-03-01 MED ORDER — AZITHROMYCIN 250 MG PO TABS: ORAL_TABLET | ORAL | 0 refills | Status: AC

## 2018-03-01 MED ORDER — ALBUTEROL SULFATE HFA 108 (90 BASE) MCG/ACT IN AERS
1.0000 | INHALATION_SPRAY | RESPIRATORY_TRACT | Status: DC | PRN
  Administered 2018-03-01: 2 via RESPIRATORY_TRACT
  Filled 2018-03-01: qty 6.7

## 2018-03-01 MED ORDER — IPRATROPIUM-ALBUTEROL 0.5-2.5 (3) MG/3ML IN SOLN
3.0000 mL | Freq: Once | RESPIRATORY_TRACT | Status: AC
  Administered 2018-03-01: 3 mL via RESPIRATORY_TRACT
  Filled 2018-03-01: qty 3

## 2018-03-01 NOTE — ED Provider Notes (Signed)
[facility] EMERGENCY DEPARTMENT Provider Note   CSN: [id number] Arrival date & time: 03/01/18  1805     History   Chief Complaint Chief Complaint  Patient presents with  . Cough    HPI Kyle Mccullough is a 84 y.o. male.  Pt presents to the ED today with cough and sinus congestion.  Pt said he's been sick for about 8 days.  He has been using pseudophed and saline spray without improvement of sx.  He has been bring up yellow phlegm.  The pt denies f/c.  He has not been around anyone who has been sick.  He is usually very healthy.     Past Medical History:  Diagnosis Date  . Hypertension     Patient Active Problem List   Diagnosis Date Noted  . Left shoulder pain 05/08/2013  . Stiffness of left shoulder joint 05/08/2013    Past Surgical History:  Procedure Laterality Date  . NECK SURGERY          Home Medications    Prior to Admission medications   Medication Sig Start Date End Date Taking? Authorizing Provider  amoxicillin (AMOXIL) 500 MG capsule Take 2 capsules (1,000 mg total) by mouth 3 (three) times daily. 08/30/13   Dione Booze, MD  azithromycin (ZITHROMAX) 250 MG tablet Take 1 every day until finished. 03/01/18   Jacalyn Lefevre, MD    Family History History reviewed. No pertinent family history.  Social History Social History   Tobacco Use  . Smoking status: Never Smoker  . Smokeless tobacco: Never Used  Substance Use Topics  . Alcohol use: No  . Drug use: No     Allergies   Patient has no known allergies.   Review of Systems Review of Systems  HENT: Positive for congestion.   Respiratory: Positive for cough.   All other systems reviewed and are negative.    Physical Exam Updated Vital Signs BP (!) 157/97 (BP Location: Left Arm)   Pulse 75   Temp 97.8 F (36.6 C) (Oral)   Resp 16   Ht 5\' 10"  (1.778 m)   Wt 80.7 kg   SpO2 94%   BMI 25.54 kg/m   Physical Exam  Constitutional: He is oriented to person, place, and time. He  appears well-developed and well-nourished.  HENT:  Head: Normocephalic and atraumatic.  Right Ear: External ear normal.  Left Ear: External ear normal.  Nose: Nose normal.  Mouth/Throat: Oropharynx is clear and moist.  Eyes: Pupils are equal, round, and reactive to light. Conjunctivae and EOM are normal.  Neck: Normal range of motion. Neck supple.  Cardiovascular: Normal rate, regular rhythm, normal heart sounds and intact distal pulses.  Pulmonary/Chest: He has wheezes.  Abdominal: Soft. Bowel sounds are normal.  Musculoskeletal: Normal range of motion.  Neurological: He is alert and oriented to person, place, and time.  Skin: Skin is warm. Capillary refill takes less than 2 seconds.  Psychiatric: He has a normal mood and affect. His behavior is normal. Judgment and thought content normal.  Nursing note and vitals reviewed.    ED Treatments / Results  Labs (all labs ordered are listed, but only abnormal results are displayed) Labs Reviewed - No data to display  EKG None  Radiology Dg Chest 2 View  Result Date: 03/01/2018 CLINICAL DATA:  Cough for 1 week. EXAM: CHEST - 2 VIEW COMPARISON:  08/30/2013 FINDINGS: Cardiac silhouette is normal in size and configuration. No mediastinal or hilar masses. There is no evidence  of adenopathy. Mild chronic bronchitic change in the lower lobes. Lungs are hyperexpanded but otherwise clear. No pleural effusion or pneumothorax. Skeletal structures are intact. IMPRESSION: No acute cardiopulmonary disease. Electronically Signed   By: Amie Portland M.D.   On: 03/01/2018 18:56    Procedures Procedures (including critical care time)  Medications Ordered in ED Medications  ipratropium-albuterol (DUONEB) 0.5-2.5 (3) MG/3ML nebulizer solution 3 mL (3 mLs Nebulization Given 03/01/18 1929)  AEROCHAMBER PLUS FLO-VU MEDIUM MISC 1 each (1 each Other Given 03/01/18 1930)  azithromycin (ZITHROMAX) tablet 500 mg (500 mg Oral Given 03/01/18 1926)     Initial  Impression / Assessment and Plan / ED Course  I have reviewed the triage vital signs and the nursing notes.  Pertinent labs & imaging results that were available during my care of the patient were reviewed by me and considered in my medical decision making (see chart for details).    Pt will be started on abx as sx have been going on for over a week.  He will also be given a duoneb here and an albuterol inhaler/spacer for home.  Final Clinical Impressions(s) / ED Diagnoses   Final diagnoses:  Acute bronchitis, unspecified organism    ED Discharge Orders         Ordered    azithromycin (ZITHROMAX) 250 MG tablet     03/01/18 1926           Jacalyn Lefevre, MD 03/01/18 2336

## 2018-03-01 NOTE — ED Notes (Signed)
Patient states that he has had a cough for about 8 days. He denies having any sick contacts, sore throat, nasal congestion, chills or fever. The cough has been productive at times. He states that he has taken some cold medications but that it has not improved.

## 2018-03-01 NOTE — ED Triage Notes (Signed)
Patient reports cough for 1 week. Productive with clear thick sputum. No fever, no emesis or nausea.

## 2019-11-28 ENCOUNTER — Emergency Department (HOSPITAL_COMMUNITY)
Admission: EM | Admit: 2019-11-28 | Discharge: 2019-11-28 | Disposition: A | Discharge status: Home / Self Care | Payer: No Typology Code available for payment source | Attending: Emergency Medicine | Admitting: Emergency Medicine

## 2019-11-28 ENCOUNTER — Encounter (HOSPITAL_COMMUNITY): Payer: Self-pay | Admitting: *Deleted

## 2019-11-28 ENCOUNTER — Other Ambulatory Visit: Payer: Self-pay

## 2019-11-28 DIAGNOSIS — I10 Essential (primary) hypertension: Secondary | ICD-10-CM | POA: Diagnosis not present

## 2019-11-28 DIAGNOSIS — M109 Gout, unspecified: Secondary | ICD-10-CM

## 2019-11-28 DIAGNOSIS — M10071 Idiopathic gout, right ankle and foot: Secondary | ICD-10-CM | POA: Diagnosis present

## 2019-11-28 MED ORDER — PREDNISONE 50 MG PO TABS: 50.0000 mg | ORAL_TABLET | Freq: Every day | ORAL | 0 refills | Status: AC

## 2019-11-28 NOTE — ED Provider Notes (Signed)
[facility] EMERGENCY DEPARTMENT Provider Note   CSN: [id number] Arrival date & time: 11/28/19  1818     History Chief Complaint  Patient presents with  . Toe Pain    Kyle Mccullough is a 86 y.o. male with PMH of sarcoidosis who presents the ED with a 3-day history of atraumatic right big toe pain.  Patient reports that he has been taking his at home Vicodin medication which relieves his symptoms, however his swelling, tenderness, and discomfort continues to persist.  He denies any history of gouty arthritis.  His pain is on the medial aspect of the first MTP joint.  Patient denies any fevers or chills, IVDA, GC infection, recent injury, rashes, or other symptoms/history.  He is able to ambulate with a hobble.  HPI     Past Medical History:  Diagnosis Date  . Hypertension     Patient Active Problem List   Diagnosis Date Noted  . Left shoulder pain 05/08/2013  . Stiffness of left shoulder joint 05/08/2013    Past Surgical History:  Procedure Laterality Date  . NECK SURGERY         No family history on file.  Social History   Tobacco Use  . Smoking status: Never Smoker  . Smokeless tobacco: Never Used  Vaping Use  . Vaping Use: Never used  Substance Use Topics  . Alcohol use: No  . Drug use: No    Home Medications Prior to Admission medications   Medication Sig Start Date End Date Taking? Authorizing Provider  amoxicillin (AMOXIL) 500 MG capsule Take 2 capsules (1,000 mg total) by mouth 3 (three) times daily. 08/30/13   Dione Booze, MD  azithromycin (ZITHROMAX) 250 MG tablet Take 1 every day until finished. 03/01/18   Jacalyn Lefevre, MD  predniSONE (DELTASONE) 50 MG tablet Take 1 tablet (50 mg total) by mouth daily with breakfast for 5 days. 11/28/19 12/03/19  Lorelee New, PA-C    Allergies    Patient has no known allergies.  Review of Systems   Review of Systems  Constitutional: Negative for chills and fever.  Musculoskeletal: Positive for arthralgias  and gait problem.  Skin: Positive for color change. Negative for wound.  Neurological: Negative for weakness and numbness.    Physical Exam Updated Vital Signs BP (!) 159/103   Pulse 71   Temp 98.1 F (36.7 C) (Oral)   Resp 18   Ht 5\' 10"  (1.778 m)   Wt 75.8 kg   SpO2 96%   BMI 23.96 kg/m   Physical Exam Vitals and nursing note reviewed. Exam conducted with a chaperone present.  Constitutional:      General: He is not in acute distress.    Appearance: Normal appearance. He is not ill-appearing.  HENT:     Head: Normocephalic and atraumatic.  Eyes:     General: No scleral icterus.    Conjunctiva/sclera: Conjunctivae normal.  Cardiovascular:     Rate and Rhythm: Normal rate and regular rhythm.     Pulses: Normal pulses.     Heart sounds: Normal heart sounds.  Pulmonary:     Effort: Pulmonary effort is normal. No respiratory distress.     Breath sounds: Normal breath sounds.  Musculoskeletal:     Cervical back: Normal range of motion.     Comments: Right foot: Mild swelling and erythema relative left foot.  Significant swelling surrounding first MTP joint with focal TTP.  No TTP elsewhere.  Tenderness appreciated even with light touch.  Capillary refill and pedal pulse intact.  Sensation intact throughout.  Patient able to actively flex first toe, albeit with discomfort.  No wounds or offending injury appreciated.  Skin:    General: Skin is dry.     Capillary Refill: Capillary refill takes less than 2 seconds.  Neurological:     Mental Status: He is alert and oriented to person, place, and time.     GCS: GCS eye subscore is 4. GCS verbal subscore is 5. GCS motor subscore is 6.  Psychiatric:        Mood and Affect: Mood normal.        Behavior: Behavior normal.        Thought Content: Thought content normal.     ED Results / Procedures / Treatments   Labs (all labs ordered are listed, but only abnormal results are displayed) Labs Reviewed - No data to  display  EKG None  Radiology No results found.  Procedures Procedures (including critical care time)  Medications Ordered in ED Medications - No data to display  ED Course  I have reviewed the triage vital signs and the nursing notes.  Pertinent labs & imaging results that were available during my care of the patient were reviewed by me and considered in my medical decision making (see chart for details).    MDM Rules/Calculators/A&P                          Patient's history physical exam is consistent with an acute gout flare involving first MTP joint right foot.  Do not feel as though imaging or laboratory work-up is warranted.  Patient denies any fevers or chills.  He also denies any IVDA, recent illness/infection, or any offending injury.  I have low suspicion for septic arthritis.  Patient is able to actively range his first toe, albeit with discomfort.  He is able to ambulate with antalgia, declines crutches offered here in the ED.  Given patient's age, feel as though his condition is best treated with steroid burst rather than NSAIDs.  Patient can follow-up with his primary care provider regarding today's encounter.  Will provide patient information on gout and low purine diet.  Patient to follow-up with his primary care provider.  Patient will begin his course of steroids for symptom morning and take his at home Vicodin medication as needed for pain relief in interim.  Patient and/or family were informed that while patient is appropriate for discharge at this time, some medical emergencies may only develop or become detectable after a period of time.  I specifically instructed patient and/or family to return to return to the ED or seek immediate medical attention for any new or worsening symptoms.  They were provided opportunity to ask any additional questions and have none at this time.  Prior to discharge patient is feeling well, agreeable with plan for discharge home.  They have  expressed understanding of verbal discharge instructions as well as return precautions and are agreeable to the plan.    Final Clinical Impression(s) / ED Diagnoses Final diagnoses:  Acute gout of right foot, unspecified cause    Rx / DC Orders ED Discharge Orders         Ordered    predniSONE (DELTASONE) 50 MG tablet  Daily with breakfast     Discontinue  Reprint     11/28/19 2012           Lorelee New, PA-C 11/28/19  2013    Pollyann Savoy, MD 11/28/19 2216

## 2019-11-28 NOTE — ED Triage Notes (Signed)
Pt c/o right great toe pain that started 4 days ago, denies any injury,

## 2019-11-28 NOTE — Discharge Instructions (Addendum)
Please read the attachments on gout and low purine eating plan.  I would like you to follow-up with your primary care provider at the [facility] regarding today's encounter for ongoing evaluation and management.  I have prescribed you a course of prednisone, please take as directed.  I would encourage you to take in the morning with breakfast as it can be stimulating.  Return to the ED or seek immediate medical attention should you experience fevers, chills, or any other new or worsening symptoms.

## 2019-12-17 ENCOUNTER — Encounter: Payer: Self-pay | Admitting: Internal Medicine

## 2020-01-08 ENCOUNTER — Ambulatory Visit: Payer: Non-veteran care | Admitting: Internal Medicine

## 2020-02-24 DEATH — deceased

## 2020-11-17 ENCOUNTER — Other Ambulatory Visit: Payer: Self-pay

## 2020-11-17 ENCOUNTER — Encounter (HOSPITAL_COMMUNITY): Payer: Self-pay | Admitting: Emergency Medicine

## 2020-11-17 ENCOUNTER — Emergency Department (HOSPITAL_COMMUNITY)
Admission: EM | Admit: 2020-11-17 | Discharge: 2020-11-17 | Disposition: A | Discharge status: Home / Self Care | Payer: No Typology Code available for payment source | Attending: Emergency Medicine | Admitting: Emergency Medicine

## 2020-11-17 DIAGNOSIS — I10 Essential (primary) hypertension: Secondary | ICD-10-CM | POA: Diagnosis not present

## 2020-11-17 DIAGNOSIS — X58XXXA Exposure to other specified factors, initial encounter: Secondary | ICD-10-CM | POA: Insufficient documentation

## 2020-11-17 DIAGNOSIS — S0502XA Injury of conjunctiva and corneal abrasion without foreign body, left eye, initial encounter: Secondary | ICD-10-CM | POA: Insufficient documentation

## 2020-11-17 DIAGNOSIS — S0592XA Unspecified injury of left eye and orbit, initial encounter: Secondary | ICD-10-CM | POA: Diagnosis present

## 2020-11-17 MED ORDER — ERYTHROMYCIN 5 MG/GM OP OINT: TOPICAL_OINTMENT | OPHTHALMIC | 0 refills | Status: AC

## 2020-11-17 MED ORDER — FLUORESCEIN SODIUM 1 MG OP STRP: 1.0000 | ORAL_STRIP | Freq: Once | OPHTHALMIC | Status: DC

## 2020-11-17 MED ORDER — TETRACAINE HCL 0.5 % OP SOLN
2.0000 [drp] | Freq: Once | OPHTHALMIC | Status: DC
  Filled 2020-11-17: qty 4

## 2020-11-17 NOTE — Discharge Instructions (Addendum)
Ointment to your left eye twice daily  Follow-up with ophthalmology within the next few days

## 2020-11-17 NOTE — ED Provider Notes (Signed)
[facility] EMERGENCY DEPARTMENT Provider Note   CSN: [id number] Arrival date & time: 11/17/20  1103    History Chief Complaint  Patient presents with  . Eye Problem    Kyle Mccullough is a 87 y.o. male with past medical history significant for recent cataract surgery 3 weeks ago who presents for evaluation of sensation of foreign body to his left eye.  Followed by [facility].  States he started putting prednisone and Toradol drops in his eyes.  No known trauma.  Not painful or itchy however has noted some clear watery drainage.  No purulent matting to eyes.  No congestion, rhinorrhea or allergy type symptoms.  Called ophthalmology who told patient to use lubricating eyedrops.  Denies fever, chills, temporal pain, facial droop, neck pain cough, rhinorrhea, congestion, paresthesias or weakness.  No pain with eye movement.  No blurred vision.  Denies additional aggravating or alleviating factors.  History obtained from patient and past medical records.  No interpreter used.  HPI     Past Medical History:  Diagnosis Date  . Hypertension     Patient Active Problem List   Diagnosis Date Noted  . Left shoulder pain 05/08/2013  . Stiffness of left shoulder joint 05/08/2013    Past Surgical History:  Procedure Laterality Date  . NECK SURGERY         History reviewed. No pertinent family history.  Social History   Tobacco Use  . Smoking status: Never  . Smokeless tobacco: Never  Vaping Use  . Vaping Use: Never used  Substance Use Topics  . Alcohol use: No  . Drug use: No    Home Medications Prior to Admission medications   Medication Sig Start Date End Date Taking? Authorizing Provider  erythromycin ophthalmic ointment Place a 1/2 inch ribbon of ointment into the lower eyelid. 11/17/20  Yes Arrianna Catala A, PA-C  amoxicillin (AMOXIL) 500 MG capsule Take 2 capsules (1,000 mg total) by mouth 3 (three) times daily. 08/30/13   Dione Booze, MD  azithromycin (ZITHROMAX) 250  MG tablet Take 1 every day until finished. 03/01/18   Jacalyn Lefevre, MD    Allergies    Patient has no known allergies.  Review of Systems   Review of Systems  Constitutional: Negative.   HENT: Negative.    Eyes:        Sensation of foreign body  Respiratory: Negative.    Cardiovascular: Negative.   Gastrointestinal: Negative.   Genitourinary: Negative.   Musculoskeletal: Negative.   Skin: Negative.   Neurological: Negative.   All other systems reviewed and are negative.  Physical Exam Updated Vital Signs BP (!) 161/98 (BP Location: Left Arm)   Pulse 88   Temp 98.7 F (37.1 C) (Oral)   Resp 18   Ht 5\' 10"  (1.778 m)   Wt 68 kg   SpO2 99%   BMI 21.52 kg/m   Physical Exam Vitals and nursing note reviewed.  Constitutional:      General: He is not in acute distress.    Appearance: He is well-developed. He is not ill-appearing, toxic-appearing or diaphoretic.  HENT:     Head: Normocephalic and atraumatic.     Comments: Nontender temporal region to head.  No jaw claudication    Nose: Nose normal.     Mouth/Throat:     Mouth: Mucous membranes are moist.  Eyes:     General: Lids are everted, no foreign bodies appreciated.        Right eye: No  foreign body, discharge or hordeolum.        Left eye: Discharge present.    Intraocular pressure: Right eye pressure is 20 mmHg. Left eye pressure is 21 mmHg.     Extraocular Movements: Extraocular movements intact.     Conjunctiva/sclera: Conjunctivae normal.     Pupils: Pupils are equal, round, and reactive to light.     Slit lamp exam:    Right eye: Anterior chamber quiet.     Visual Fields: Right eye visual fields normal and left eye visual fields normal.      Comments: Clear, watery discharge to left eye.  No obvious foreign body.  EOMs intact without pain.  Conjunctive a bilaterally without injection, chemosis or hemorrhage.  Small 50mm, fluorescein uptake to left lower outer quadrant I  Cardiovascular:     Rate and  Rhythm: Normal rate and regular rhythm.  Pulmonary:     Effort: Pulmonary effort is normal. No respiratory distress.  Abdominal:     General: There is no distension.     Palpations: Abdomen is soft.  Musculoskeletal:        General: Normal range of motion.     Cervical back: Normal range of motion and neck supple.  Skin:    General: Skin is warm and dry.     Capillary Refill: Capillary refill takes less than 2 seconds.  Neurological:     General: No focal deficit present.     Mental Status: He is alert and oriented to person, place, and time.     Cranial Nerves: No cranial nerve deficit.     Sensory: No sensory deficit.     Motor: No weakness.   ED Results / Procedures / Treatments   Labs (all labs ordered are listed, but only abnormal results are displayed) Labs Reviewed - No data to display  EKG None  Radiology No results found.  Procedures Procedures   Medications Ordered in ED Medications  fluorescein ophthalmic strip 1 strip (has no administration in time range)  tetracaine (PONTOCAINE) 0.5 % ophthalmic solution 2 drop (has no administration in time range)    ED Course  I have reviewed the triage vital signs and the nursing notes.  Pertinent labs & imaging results that were available during my care of the patient were reviewed by me and considered in my medical decision making (see chart for details).  Here for sensation of foreign body to his left eye.  He is afebrile, nonseptic, not ill-appearing.  Pupils equal reactive to light.  Full range of motion without difficulty.  IOP within normal limits within bilateral eyes.  No pain.  He has had some clear watery drainage.  No obvious traumatic injuries.  He is 4 mm corneal abrasion to the left lower outer portion of his left eye.  No corneal ulcerations.  Posterior chamber quiet.  Low suspicion for vitreal infection, corneal ulcer, acute angle glaucoma, retinal detachment.  Denies additional aggravating or alleviating  factors. Tdap up to date per patient.  No evidence of FB.  No change in vision, acuity equal bilaterally.  Pt is not a contact lens wearer.  Exam non-concerning for orbital cellulitis, hyphema, corneal ulcers. Patient will be discharged home with erythromycin.   Patient understands to follow up with ophthalmology, & to return to ER if new symptoms develop including change in vision, purulent drainage, or entrapment.   The patient has been appropriately medically screened and/or stabilized in the ED. I have low suspicion for any other emergent  medical condition which would require further screening, evaluation or treatment in the ED or require inpatient management.  Patient is hemodynamically stable and in no acute distress.  Patient able to ambulate in department prior to ED.  Evaluation does not show acute pathology that would require ongoing or additional emergent interventions while in the emergency department or further inpatient treatment.  I have discussed the diagnosis with the patient and answered all questions.  Pain is been managed while in the emergency department and patient has no further complaints prior to discharge.  Patient is comfortable with plan discussed in room and is stable for discharge at this time.  I have discussed strict return precautions for returning to the emergency department.  Patient was encouraged to follow-up with PCP/specialist refer to at discharge.     MDM Rules/Calculators/A&P                            Final Clinical Impression(s) / ED Diagnoses Final diagnoses:  Abrasion of left cornea, initial encounter    Rx / DC Orders ED Discharge Orders          Ordered    erythromycin ophthalmic ointment        11/17/20 1340             Mithran Strike A, PA-C 11/17/20 1341    Blane Ohara, MD 11/19/20 1740

## 2020-11-17 NOTE — ED Triage Notes (Signed)
Pt c/o left eye irritation for the past few days. Pt concerned that he may have dust in his eye.

## 2021-01-23 ENCOUNTER — Other Ambulatory Visit: Payer: Self-pay

## 2021-01-23 ENCOUNTER — Emergency Department (HOSPITAL_COMMUNITY)
Admission: EM | Admit: 2021-01-23 | Discharge: 2021-01-24 | Disposition: A | Discharge status: Home / Self Care | Payer: No Typology Code available for payment source | Attending: Emergency Medicine | Admitting: Emergency Medicine

## 2021-01-23 ENCOUNTER — Encounter (HOSPITAL_COMMUNITY): Payer: Self-pay | Admitting: Emergency Medicine

## 2021-01-23 DIAGNOSIS — M25551 Pain in right hip: Secondary | ICD-10-CM | POA: Insufficient documentation

## 2021-01-23 DIAGNOSIS — R010 Benign and innocent cardiac murmurs: Secondary | ICD-10-CM | POA: Diagnosis not present

## 2021-01-23 DIAGNOSIS — I10 Essential (primary) hypertension: Secondary | ICD-10-CM | POA: Diagnosis not present

## 2021-01-23 DIAGNOSIS — M5410 Radiculopathy, site unspecified: Secondary | ICD-10-CM

## 2021-01-23 NOTE — ED Triage Notes (Signed)
Patient c/o pain that starts in the back of right leg and radiates into right groin and penis. Per patient woke him from sleep this morning. Patient denies any swelling of groin, testicles, or penis. Patient also denies any injury, difficulty urinating, blood in urine, or discharge from penis.

## 2021-01-23 NOTE — ED Provider Notes (Signed)
[facility] EMERGENCY DEPARTMENT Provider Note   CSN: [id number] Arrival date & time: 01/23/21  1535     History Chief Complaint  Patient presents with   Groin Pain    Kyle Mccullough is a 87 y.o. male.  [name] is an 87 year old male who presents today for evaluation of right-sided hip pain of several month duration that acutely worsened last night waking him up at 2 am with shooting pain posterior aspect of his right upper buttock, and perineal area, including the head of the penis. He feels the head of the penis is being constricted. He denies dysuria, genital swelling, trauma, and penile discharge. The radicular pain in the posterior aspect of the leg has been waxing and waning since last night. He has not tried anything OTC for this. Positional changes do not make a difference. The radicular pain and pain to the head of penis are new since last night. He denies illicit drug use, loss of bowel or bladder, unintentional weight loss, or any recent trauma.   The history is provided by the patient.      Past Medical History:  Diagnosis Date   Hypertension     Patient Active Problem List   Diagnosis Date Noted   Left shoulder pain 05/08/2013   Stiffness of left shoulder joint 05/08/2013    Past Surgical History:  Procedure Laterality Date   NECK SURGERY     RADIOACTIVE SEED IMPLANT         No family history on file.  Social History   Tobacco Use   Smoking status: Never   Smokeless tobacco: Never  Vaping Use   Vaping Use: Never used  Substance Use Topics   Alcohol use: No   Drug use: No    Home Medications Prior to Admission medications   Medication Sig Start Date End Date Taking? Authorizing Provider  amoxicillin (AMOXIL) 500 MG capsule Take 2 capsules (1,000 mg total) by mouth 3 (three) times daily. 08/30/13   Dione Booze, MD  azithromycin (ZITHROMAX) 250 MG tablet Take 1 every day until finished. 03/01/18   Jacalyn Lefevre, MD  erythromycin ophthalmic  ointment Place a 1/2 inch ribbon of ointment into the lower eyelid. 11/17/20   Henderly, Britni A, PA-C    Allergies    Patient has no known allergies.  Review of Systems   Review of Systems  Constitutional:  Negative for activity change, appetite change, chills, fever and unexpected weight change.  Gastrointestinal:  Negative for abdominal distention, constipation and nausea.  Genitourinary:  Positive for penile pain. Negative for decreased urine volume, difficulty urinating, dysuria, penile discharge, penile swelling and scrotal swelling.  Musculoskeletal:  Positive for arthralgias (hip pain). Negative for back pain, gait problem and myalgias.  All other systems reviewed and are negative.  Physical Exam Updated Vital Signs BP (!) 163/94 (BP Location: Right Arm)   Pulse 75   Temp 98.1 F (36.7 C) (Oral)   Resp 20   Ht 5\' 10"  (1.778 m)   Wt 68 kg   SpO2 98%   BMI 21.52 kg/m   Physical Exam Vitals and nursing note reviewed.  Constitutional:      General: He is not in acute distress.    Appearance: Normal appearance.  HENT:     Head: Normocephalic and atraumatic.  Eyes:     Conjunctiva/sclera: Conjunctivae normal.  Cardiovascular:     Rate and Rhythm: Normal rate. Rhythm irregular.     Comments: Sinus arrhythmia  Pulmonary:  Effort: Pulmonary effort is normal. No respiratory distress.     Breath sounds: Normal breath sounds. No wheezing.  Abdominal:     General: Abdomen is flat. There is no distension.     Palpations: Abdomen is soft.     Tenderness: There is no abdominal tenderness. There is no guarding.  Genitourinary:    Pubic Area: No rash.      Penis: Normal and uncircumcised. No tenderness, discharge, swelling or lesions.      Testes: Normal.        Right: Tenderness or swelling not present.        Left: Tenderness or swelling not present.  Musculoskeletal:        General: Normal range of motion.     Cervical back: Normal range of motion.     Comments:  Full ROM in right lower extremity. 5/5 strength bilaterally in lower extremities. Sensation equal bilaterally. SLR test negative bilaterally. TTP present over right hip, otherwise no TTP.  Neurological:     General: No focal deficit present.     Mental Status: He is alert. Mental status is at baseline.    ED Results / Procedures / Treatments   Labs (all labs ordered are listed, but only abnormal results are displayed) Labs Reviewed - No data to display  EKG None  Radiology No results found.  Procedures Procedures   Medications Ordered in ED Medications - No data to display  ED Course  I have reviewed the triage vital signs and the nursing notes.  Pertinent labs & imaging results that were available during my care of the patient were reviewed by me and considered in my medical decision making (see chart for details).    MDM Rules/Calculators/A&P                           87 year old male presents with acute episode of radicular pain in his right posterior upper leg including buttock, and penis. He is well appearing and stable. Ambulating without difficulty. Will evaluate with CT pelvis to rule out tumor compressing sacral nerves leading to his symptomology.  If CT negative he is appropriate for discharge with steroids. Likely radicular pain with an acute exacerbation. [facility] with [facility]   Patient signed out to Dr. Pilar Plate at end of shift for follow up on imaging and disposition.   Final Clinical Impression(s) / ED Diagnoses Final diagnoses:  None    Rx / DC Orders ED Discharge Orders     None        Marita Kansas, PA-C 01/23/21 2341    Eber Hong, MD 01/25/21 [phone]

## 2021-01-23 NOTE — ED Provider Notes (Signed)
  Provider Note MRN:  [id number]  Arrival date & time: 01/24/21    ED Course and Medical Decision Making  Assumed care from Dr. Hyacinth Meeker at shift change.  Possibly a radicular back pain but having some radiation to the tip of the penis.  Has been present for a while, obtaining screening CT to evaluate for evidence of malignancy.  If reassuring, appropriate for discharge on steroids.  [facility]  Procedures  Final Clinical Impressions(s) / ED Diagnoses     ICD-10-CM   1. Radicular pain  M54.10       ED Discharge Orders          Ordered    predniSONE (DELTASONE) 20 MG tablet  Daily        01/24/21 0114              Discharge Instructions      You were evaluated in the Emergency Department and after careful evaluation, we did not find any emergent condition requiring admission or further testing in the hospital.  Your exam/testing today was overall reassuring.  Symptoms may be due to a pinched nerve in the back.  Please take the steroids as directed and follow-up with your regular doctors.  Please return to the Emergency Department if you experience any worsening of your condition.  Thank you for allowing [place] to be a part of your care.       Elmer Sow. Pilar Plate, MD [facility] [email or web address]    Sabas Sous, MD 01/24/21 [phone]

## 2021-01-23 NOTE — ED Provider Notes (Signed)
This patient is a 87 year old male with a history of some right buttock and thigh pain posteriorly, has been intermittent but acutely worsened overnight with radiation of pain into the perianal region perineum and the penis.  He states he felt like somebody had wrapped an elastic band around the tip of his penis last night while he was sleeping.  On exam the patient has a soft nontender abdomen, he is well-appearing with normal vital signs.  His perineal region was inspected and has a normal uncircumcised penis with good retractable foreskin and normal-appearing head of the penis.  The shaft is normal in appearance the testicles and scrotum are normal in appearance, there is no reproducible tenderness no obvious swelling, normal appearance of the testicles.  His perineal region is totally normal as well without rashes or tenderness or crepitance or subcutaneous emphysema, there is no tenderness in the thigh, nothing is reproducible.  He is able to ambulate and has normal strength and sensation of the legs.  Will order CT scan to make sure there is no signs of tumor surrounding the lower spine or pelvis or down potentially compressing sacral nerves.  Patient agreeable  Medical screening examination/treatment/procedure(s) were conducted as a shared visit with non-physician practitioner(s) and myself.  I personally evaluated the patient during the encounter.  Clinical Impression:   Final diagnoses:  Radicular pain         Eber Hong, MD 01/25/21 [phone]

## 2021-01-24 ENCOUNTER — Emergency Department (HOSPITAL_COMMUNITY): Payer: No Typology Code available for payment source

## 2021-01-24 DIAGNOSIS — M25551 Pain in right hip: Secondary | ICD-10-CM | POA: Diagnosis not present

## 2021-01-24 LAB — I-STAT CREATININE, ED: Creatinine, Ser: 1.1 mg/dL (ref 0.61–1.24)

## 2021-01-24 MED ORDER — PREDNISONE 20 MG PO TABS: 40.0000 mg | ORAL_TABLET | Freq: Every day | ORAL | 0 refills | Status: AC

## 2021-01-24 MED ORDER — IOHEXOL 300 MG/ML  SOLN
75.0000 mL | Freq: Once | INTRAMUSCULAR | Status: AC | PRN
  Administered 2021-01-24: 100 mL via INTRAVENOUS

## 2021-01-24 NOTE — Discharge Instructions (Addendum)
You were evaluated in the Emergency Department and after careful evaluation, we did not find any emergent condition requiring admission or further testing in the hospital.  Your exam/testing today was overall reassuring.  Symptoms may be due to a pinched nerve in the back.  Your CT scan was overall normal and reassuring.  Please take the steroids as directed and follow-up with your regular doctors.  Please return to the Emergency Department if you experience any worsening of your condition.  Thank you for allowing [place] to be a part of your care.

## 2022-06-03 IMAGING — CT CT PELVIS W/ CM
2 of 6 series · 13 of 46 positions shown, 18 images · IV contrast (Omnipaque or Isovue)
Comparison: None.

CLINICAL DATA: Acute pelvic pain radiating into the right hip,
initial encounter

EXAM:
CT PELVIS WITH CONTRAST
TECHNIQUE: Multidetector CT imaging of the pelvis was performed using the
standard protocol following the bolus administration of intravenous
contrast.
CONTRAST:  100mL OMNIPAQUE IOHEXOL 300 MG/ML  SOLN

[Series 2: axial st · axial · 0.82mm/px · z∈[-412,-138]mm · 10 of 65 slices shown, 15 images]
[im 5/65  soft-tissue]
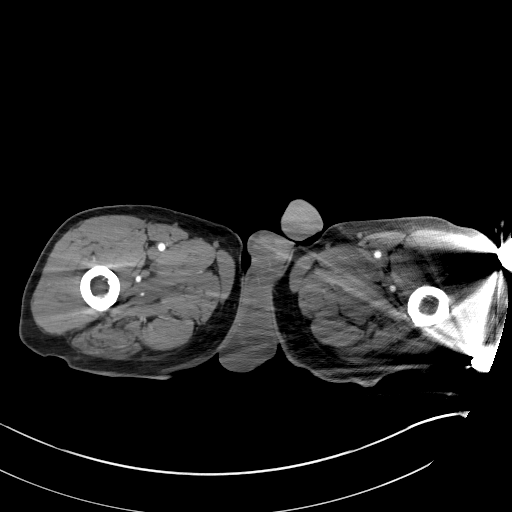
[im 5/65  bone]
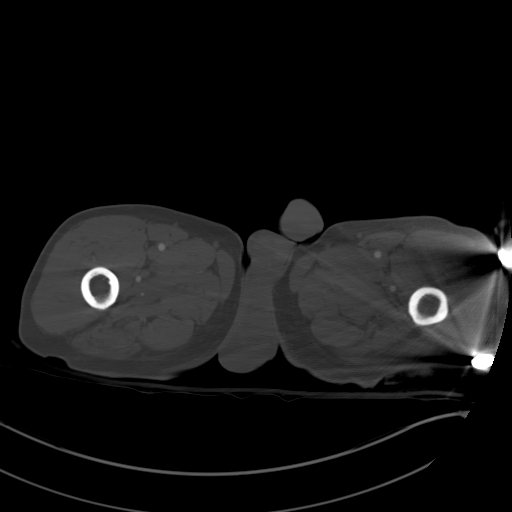
[im 13/65  soft-tissue]
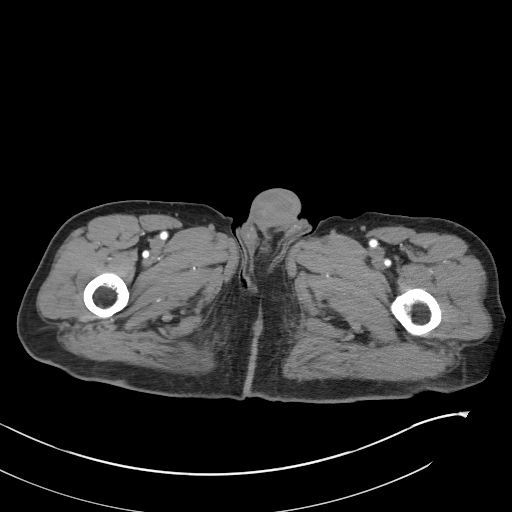
[im 18/65  soft-tissue]
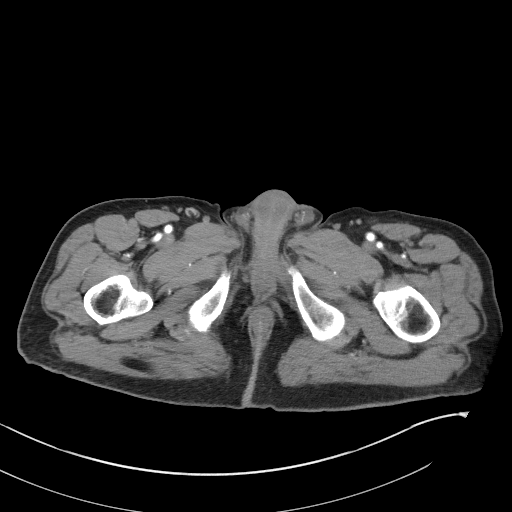
[im 26/65  soft-tissue]
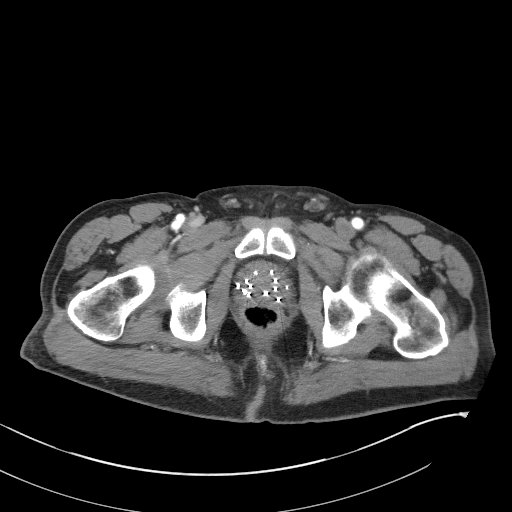
[im 35/65  soft-tissue]
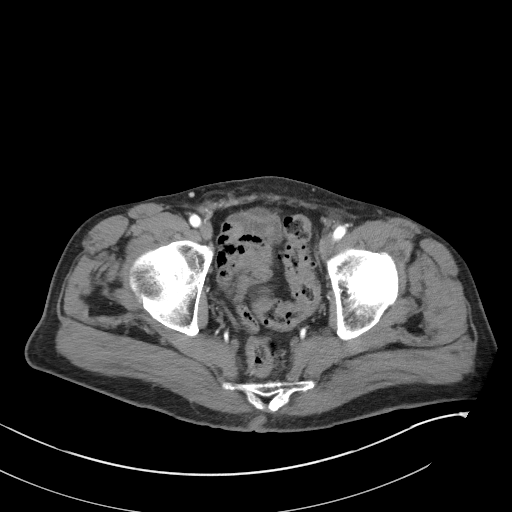
[im 39/65  soft-tissue]
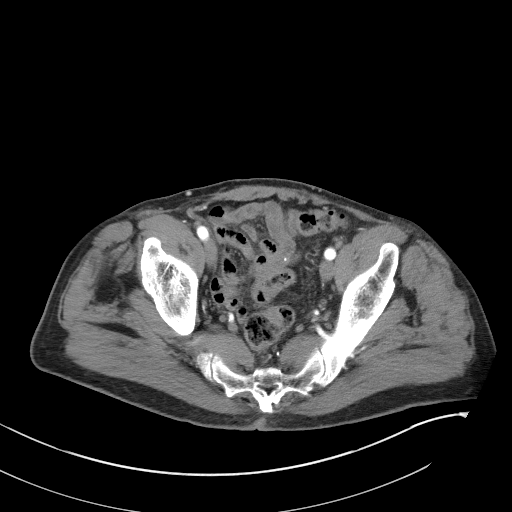
[im 47/65  soft-tissue]
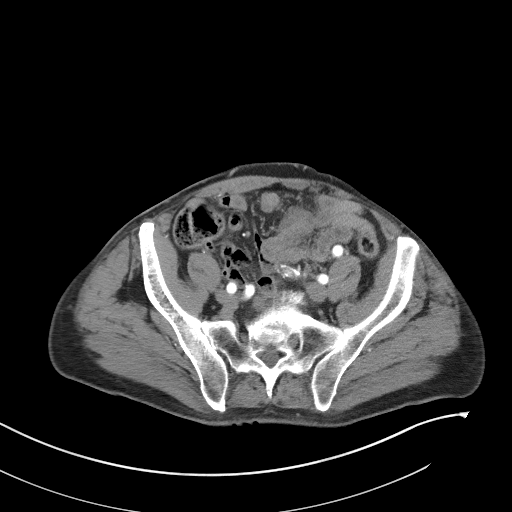
[im 47/65  lung]
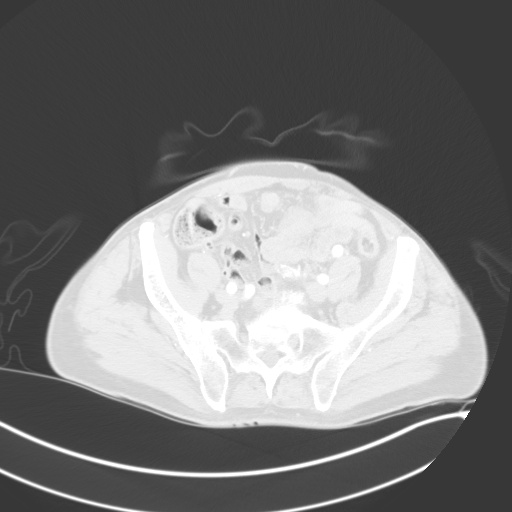
[im 52/65  soft-tissue]
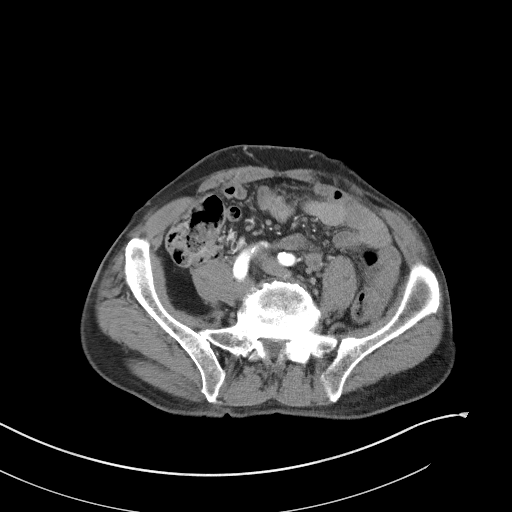
[im 52/65  lung]
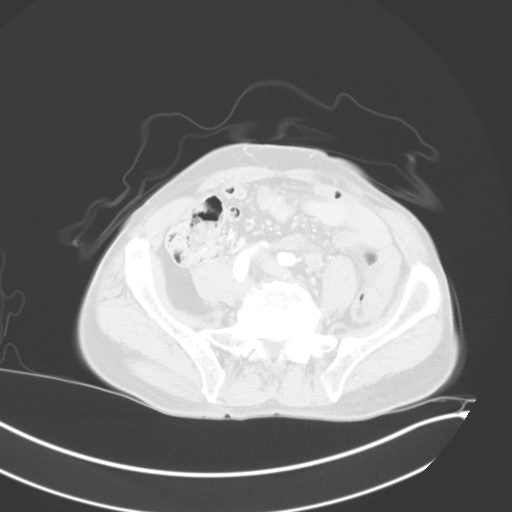
[im 56/65  lung]
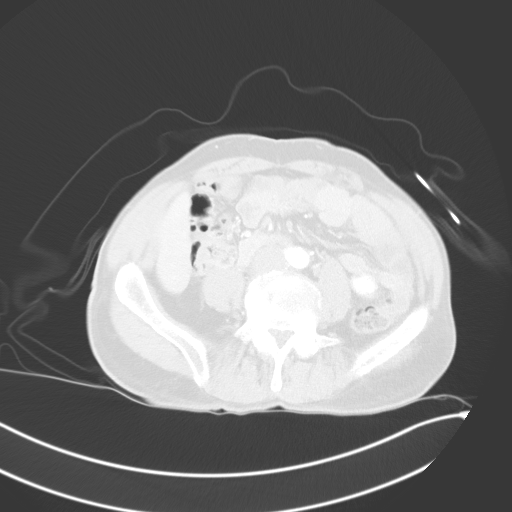
[im 60/65  soft-tissue]
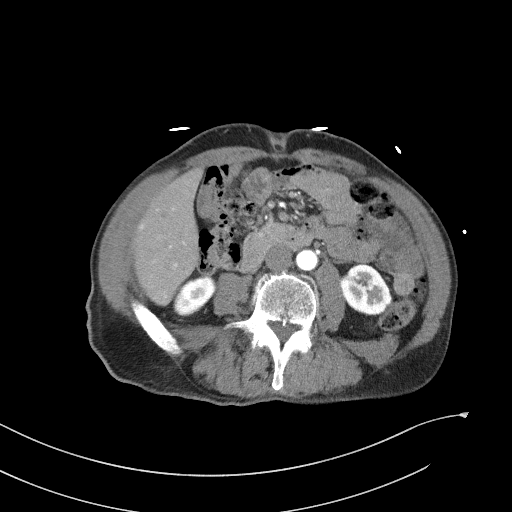
[im 60/65  lung]
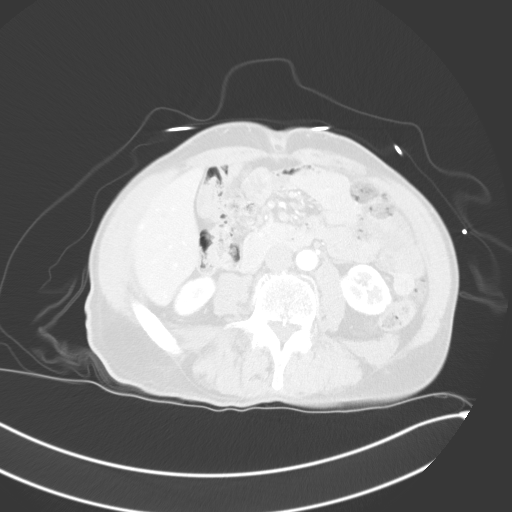
[im 60/65  bone]
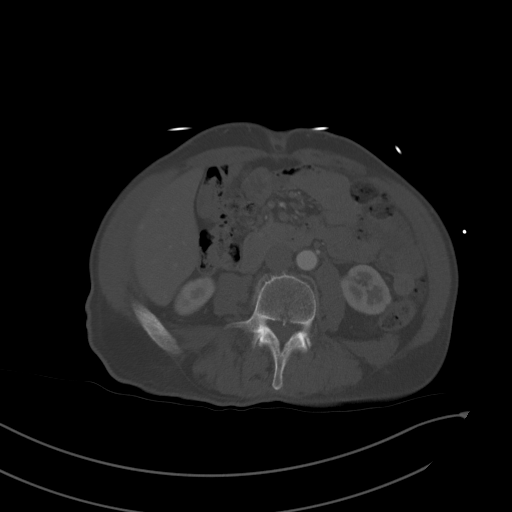

[Series 4: coronal st · coronal · 0.65mm/px · 3 of 75 slices shown]
[im 19/75  soft-tissue]
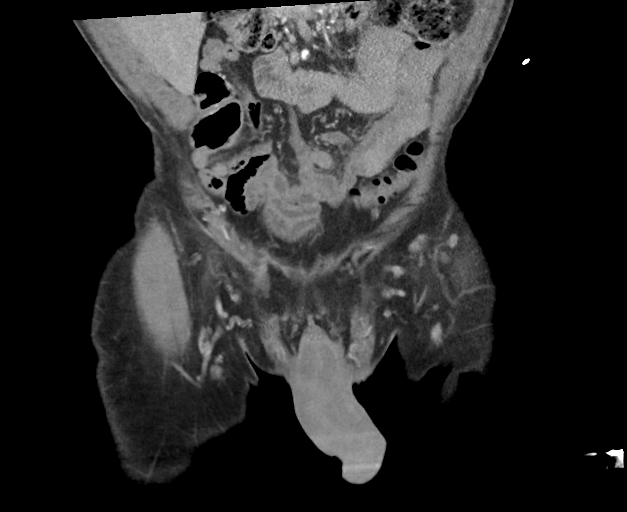
[im 38/75  soft-tissue]
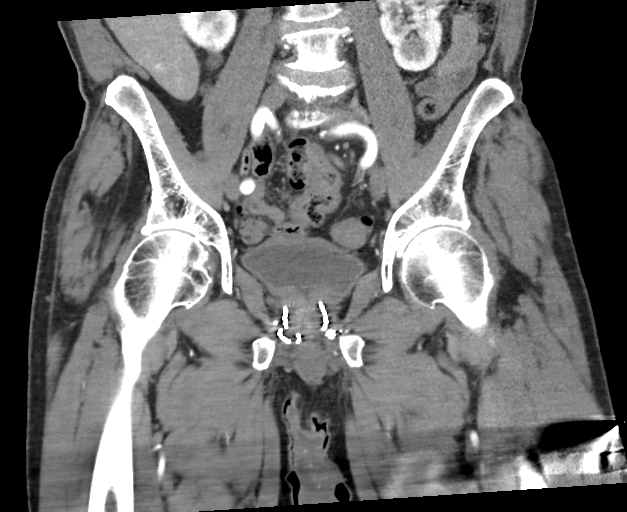
[im 56/75  soft-tissue]
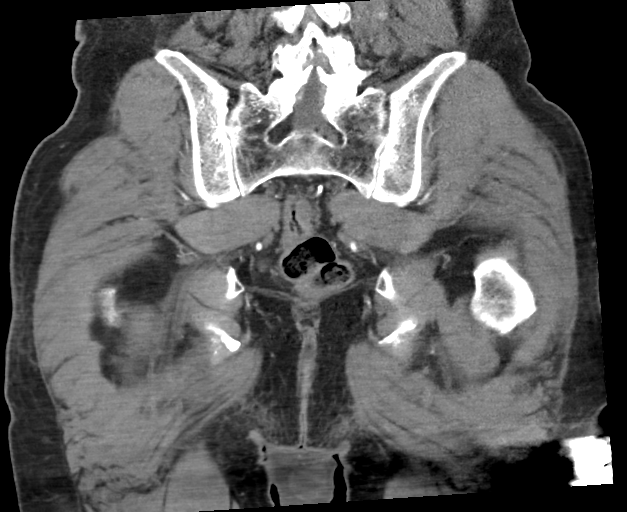

[13 of 46 positions shown; findings below may reference images not displayed]

FINDINGS: Urinary Tract: Visualized portions of the kidneys demonstrate normal
enhancement pattern. The bladder is partially distended.

Bowel: Scattered diverticular change of the colon is noted. No
diverticulitis is seen. Visualized small bowel is unremarkable.

Vascular/Lymphatic: Atherosclerotic calcifications are noted. No
focal stenosis is seen. Sizable adenopathy is noted.

Reproductive: Prostate demonstrates multiple radiopaque densities
within consistent with prior seed implantation.

Other: Fatty infiltration of the liver is noted. There are findings
suspicious for cholelithiasis without complicating factors.

Musculoskeletal: Degenerative changes of lumbar spine are noted.
Central canal stenosis is noted at L4-5 in part related to disc
bulging as well as facet hypertrophic changes. Similar changes are
noted at L3-4 but to a slightly lesser degree. No specific
abnormality to correspond with the given clinical history is noted.
IMPRESSION: Diverticulosis without diverticulitis.

Chronic degenerative changes of lumbar spine with central canal
stenosis at L4-5 and to a lesser degree at L3-4.

No specific abnormality to correspond with the given clinical
history is noted.

## 2022-10-28 ENCOUNTER — Encounter (HOSPITAL_COMMUNITY): Payer: Self-pay | Admitting: Physical Therapy

## 2022-10-28 ENCOUNTER — Ambulatory Visit (HOSPITAL_COMMUNITY): Payer: No Typology Code available for payment source | Attending: Internal Medicine | Admitting: Physical Therapy

## 2022-10-28 DIAGNOSIS — R2689 Other abnormalities of gait and mobility: Secondary | ICD-10-CM | POA: Insufficient documentation

## 2022-10-28 DIAGNOSIS — M6281 Muscle weakness (generalized): Secondary | ICD-10-CM | POA: Diagnosis present

## 2022-10-28 NOTE — Therapy (Signed)
OUTPATIENT PHYSICAL THERAPY LOWER EXTREMITY EVALUATION   Patient Name: Kyle Mccullough MRN: [id number] DOB:[birth year], 89 y.o., male Today's Date: 10/28/2022  END OF SESSION:  PT End of Session - 10/28/22 1034     Visit Number 1    Number of Visits 4    Date for PT Re-Evaluation 11/25/22    Authorization Type Primary: [facility] Secondary: medicare    Progress Note Due on Visit 10    PT Start Time 1035    PT Stop Time 1113    PT Time Calculation (min) 38 min    Activity Tolerance Patient tolerated treatment well    Behavior During Therapy WFL for tasks assessed/performed             Past Medical History:  Diagnosis Date   Hypertension    Past Surgical History:  Procedure Laterality Date   NECK SURGERY     RADIOACTIVE SEED IMPLANT     Patient Active Problem List   Diagnosis Date Noted   Left shoulder pain 05/08/2013   Stiffness of left shoulder joint 05/08/2013    [facility]  REFERRING PROVIDER: Rebeca Allegra, MD  REFERRING DIAG: R26.89 (ICD-10-CM) - Other abnormalities of gait and mobility   THERAPY DIAG:  Other abnormalities of gait and mobility  Muscle weakness (generalized)  Rationale for Evaluation and Treatment: Rehabilitation  ONSET DATE: about 1 year  SUBJECTIVE:   SUBJECTIVE STATEMENT: Patient states numbness in both lateral legs. Feet get cold. Sometimes has trouble with walking and balance because of it. Does have problems with back but its not bothering him as much as he used to. Has been using pedaling machine has been helping at home.   PERTINENT HISTORY: HTN, DM PAIN:  Are you having pain? No  PRECAUTIONS: None  WEIGHT BEARING RESTRICTIONS: No  FALLS:  Has patient fallen in last 6 months? No  OCCUPATION: Retired  PLOF: Independent  PATIENT GOALS: improve sensation   OBJECTIVE:   COGNITION:Overall cognitive status: Within functional limits for tasks assessed     SENSATION: WFL Lateral LE feeling numb somewhat   POSTURE:  No Significant postural limitations  PALPATION: TTP R proximal glute max   LUMBAR AROM: WFL all planes  LOWER EXTREMITY ROM: decreased R hip abduction and extension  Active ROM Right eval Left eval  Hip flexion    Hip extension    Hip abduction    Hip adduction    Hip internal rotation    Hip external rotation    Knee flexion    Knee extension    Ankle dorsiflexion    Ankle plantarflexion    Ankle inversion    Ankle eversion     (Blank rows = not tested)  LOWER EXTREMITY MMT:  MMT Right eval Left eval  Hip flexion 5 5  Hip extension 3- 3  Hip abduction 3- 4+  Hip adduction    Hip internal rotation    Hip external rotation    Knee flexion 5 5  Knee extension 5 5  Ankle dorsiflexion 4+ 4+  Ankle plantarflexion    Ankle inversion    Ankle eversion     (Blank rows = not tested)   FUNCTIONAL TESTS:  5 times sit to stand: 21.01 seconds without UE support 2 minute walk test: 255 feet  GAIT: Distance walked:  255 feet Assistive device utilized: None Level of assistance: Modified independence Comments: , slightly slow, antalgic on R hip slightly, mild/mod unsteadiness/sway   TODAY'S TREATMENT:  DATE:  10/28/22 Standing hip abduction 1 x 10 bilateral  Standing hip extension 1 x 10 bilateral     PATIENT EDUCATION:  Education details: Patient educated on exam findings, POC, scope of PT, HEP. Person educated: Patient Education method: Explanation, Demonstration, and Handouts Education comprehension: verbalized understanding, returned demonstration, verbal cues required, and tactile cues required  HOME EXERCISE PROGRAM: Access Code: [email or web address] URL: [email or web address]  Date: 10/28/2022 - Standing Hip Abduction with Counter Support  - 2-3 x daily - 7 x weekly - 2 sets - 10 reps - Standing Hip Extension with Counter  Support  - 2-3 x daily - 7 x weekly - 2 sets - 10 reps  ASSESSMENT:  CLINICAL IMPRESSION: Patient a 89 y.o. y.o. male who was seen today for physical therapy evaluation and treatment for abnormalities of gait and mobility. Patient presents with  deficits in bilateral LE strength, ROM, endurance, activity tolerance, sensation, gait, balance, and functional mobility with ADL. Patient is having to modify and restrict ADL as indicated by outcome measure score as well as subjective information and objective measures which is affecting overall participation. Patient will benefit from skilled physical therapy in order to improve function and reduce impairment.  OBJECTIVE IMPAIRMENTS: Abnormal gait, decreased activity tolerance, decreased balance, decreased endurance, decreased mobility, difficulty walking, decreased strength, impaired flexibility, and improper body mechanics.   ACTIVITY LIMITATIONS: carrying, lifting, bending, standing, squatting, stairs, transfers, locomotion level, and caring for others  PARTICIPATION LIMITATIONS: meal prep, cleaning, laundry, shopping, community activity, and yard work  PERSONAL FACTORS: Age, Time since onset of injury/illness/exacerbation, and 1 comorbidity: HTN  are also affecting patient's functional outcome.   REHAB POTENTIAL: Good  CLINICAL DECISION MAKING: Stable/uncomplicated  EVALUATION COMPLEXITY: Low   GOALS: Goals reviewed with patient? Yes  SHORT TERM GOALS: Target date: 11/11/2022    Patient will be independent with HEP in order to improve functional outcomes. Baseline: Goal status: INITIAL  2.  Patient will report at least 25% improvement in symptoms for improved quality of life. Baseline: Goal status: INITIAL   LONG TERM GOALS: Target date: 11/25/2022    Patient will report at least 75% improvement in symptoms for improved quality of life. Baseline:  Goal status: INITIAL  2.   Patient will demonstrate grade of 4+/5 MMT grade in all  tested musculature as evidence of improved strength to assist with stair ambulation and gait.   Baseline: see above Goal status: INITIAL  3.  Patient will be able to complete 5x STS in under 15 seconds in order to reduce the risk of falls. Baseline: 21.01 seconds without UE support Goal status: INITIAL  4.  Patient will be able to ambulate at least 350 feet in in order to demonstrate improved tolerance to activity. Baseline: 255 feet Goal status: INITIAL    PLAN:  PT FREQUENCY: 1x/week  PT DURATION: 4 weeks  PLANNED INTERVENTIONS: Therapeutic exercises, Therapeutic activity, Neuromuscular re-education, Balance training, Gait training, Patient/Family education, Joint manipulation, Joint mobilization, Stair training, Orthotic/Fit training, DME instructions, Aquatic Therapy, Dry Needling, Electrical stimulation, Spinal manipulation, Spinal mobilization, Cryotherapy, Moist heat, Compression bandaging, scar mobilization, Splintting, Taping, Traction, Ultrasound, Ionotophoresis 4mg /ml Dexamethasone, and Manual therapy  PLAN FOR NEXT SESSION: hip strength, functional strength, balance training, build HEP   Reola Mosher Samit Sylve, PT 10/28/2022, 11:12 AM

## 2022-11-11 ENCOUNTER — Ambulatory Visit (HOSPITAL_COMMUNITY): Payer: No Typology Code available for payment source | Admitting: Physical Therapy

## 2022-11-11 ENCOUNTER — Encounter (HOSPITAL_COMMUNITY): Payer: Self-pay | Admitting: Physical Therapy

## 2022-11-11 DIAGNOSIS — R2689 Other abnormalities of gait and mobility: Secondary | ICD-10-CM | POA: Diagnosis not present

## 2022-11-11 DIAGNOSIS — M6281 Muscle weakness (generalized): Secondary | ICD-10-CM

## 2022-11-11 NOTE — Therapy (Signed)
OUTPATIENT PHYSICAL THERAPY TREATMENT   Patient Name: Kyle Mccullough MRN: [id number] DOB:30-Jun-1933, 89 y.o., male Today's Date: 11/11/2022  END OF SESSION:  PT End of Session - 11/11/22 1034     Visit Number 2    Number of Visits 4    Date for PT Re-Evaluation 11/25/22    Authorization Type Primary: [facility] Secondary: medicare    Progress Note Due on Visit 10    PT Start Time 1034    PT Stop Time 1113    PT Time Calculation (min) 39 min    Activity Tolerance Patient tolerated treatment well    Behavior During Therapy WFL for tasks assessed/performed             Past Medical History:  Diagnosis Date   Hypertension    Past Surgical History:  Procedure Laterality Date   NECK SURGERY     RADIOACTIVE SEED IMPLANT     Patient Active Problem List   Diagnosis Date Noted   Left shoulder pain 05/08/2013   Stiffness of left shoulder joint 05/08/2013    [facility]  REFERRING PROVIDER: Rebeca Allegra, MD  REFERRING DIAG: R26.89 (ICD-10-CM) - Other abnormalities of gait and mobility   THERAPY DIAG:  Other abnormalities of gait and mobility  Muscle weakness (generalized)  Rationale for Evaluation and Treatment: Rehabilitation  ONSET DATE: about 1 year  SUBJECTIVE:   SUBJECTIVE STATEMENT: Patient states HEP is going well. Pretty easy.   EVAL: Patient states numbness in both lateral legs. Feet get cold. Sometimes has trouble with walking and balance because of it. Does have problems with back but its not bothering him as much as he used to. Has been using pedaling machine has been helping at home.   PERTINENT HISTORY: HTN, DM PAIN:  Are you having pain? No  PRECAUTIONS: None  WEIGHT BEARING RESTRICTIONS: No  FALLS:  Has patient fallen in last 6 months? No  OCCUPATION: Retired  PLOF: Independent  PATIENT GOALS: improve sensation   OBJECTIVE:   COGNITION:Overall cognitive status: Within functional limits for tasks assessed     SENSATION: WFL Lateral  LE feeling numb somewhat   POSTURE: No Significant postural limitations  PALPATION: TTP R proximal glute max   LUMBAR AROM: WFL all planes  LOWER EXTREMITY ROM: decreased R hip abduction and extension  Active ROM Right eval Left eval  Hip flexion    Hip extension    Hip abduction    Hip adduction    Hip internal rotation    Hip external rotation    Knee flexion    Knee extension    Ankle dorsiflexion    Ankle plantarflexion    Ankle inversion    Ankle eversion     (Blank rows = not tested)  LOWER EXTREMITY MMT:  MMT Right eval Left eval  Hip flexion 5 5  Hip extension 3- 3  Hip abduction 3- 4+  Hip adduction    Hip internal rotation    Hip external rotation    Knee flexion 5 5  Knee extension 5 5  Ankle dorsiflexion 4+ 4+  Ankle plantarflexion    Ankle inversion    Ankle eversion     (Blank rows = not tested)   FUNCTIONAL TESTS:  5 times sit to stand: 21.01 seconds without UE support 2 minute walk test: 255 feet  GAIT: Distance walked:  255 feet Assistive device utilized: None Level of assistance: Modified independence Comments: , slightly slow, antalgic on R hip slightly, mild/mod  unsteadiness/sway   TODAY'S TREATMENT:                                                                                                                              DATE:  11/11/22 Standing hip abduction with GTB at knees 1 x 10 bilateral  Standing hip extension with GTB at knees 1 x 10 bilateral   Standing HR 2x 10 Standing TR 2 x 10 Lateral step up 2 x 10 bilateral  LAQ 10 x 5 second holds bilateral  STS 1 x 10    10/28/22 Standing hip abduction 1 x 10 bilateral  Standing hip extension 1 x 10 bilateral     PATIENT EDUCATION:  Education details: 7/19: HEP; EVAL: Patient educated on exam findings, POC, scope of PT, HEP. Person educated: Patient Education method: Explanation, Demonstration, and Handouts Education comprehension: verbalized understanding,  returned demonstration, verbal cues required, and tactile cues required  HOME EXERCISE PROGRAM: Access Code: [email or web address] URL: [email or web address] 11/11/22 - Standing Hip Abduction with Resistance at Ankles and Counter Support  - 1 x daily - 7 x weekly - 2 sets - 10 reps - Standing Hip Extension with Resistance at Ankles and Counter Support  - 1 x daily - 7 x weekly - 2 sets - 10 reps - Heel Raises with Counter Support  - 1 x daily - 7 x weekly - 2 sets - 10 reps - Toe Raises with Counter Support  - 1 x daily - 7 x weekly - 2 sets - 10 reps - Lateral Step Up  - 1 x daily - 7 x weekly - 2 sets - 10 reps - Seated Long Arc Quad  - 1 x daily - 7 x weekly - 2 sets - 10 reps - Sit to Stand with Arms Crossed  - 1 x daily - 7 x weekly - 2 sets - 10 reps   Date: 10/28/2022 - Standing Hip Abduction with Counter Support  - 2-3 x daily - 7 x weekly - 2 sets - 10 reps - Standing Hip Extension with Counter Support  - 2-3 x daily - 7 x weekly - 2 sets - 10 reps  ASSESSMENT:  CLINICAL IMPRESSION: Patient tolerates resistance to previously completed exercises. Began additional LE strengthening which patient states is easy but does require rest breaks following each exercise. Fatigue 8/10 following step up exercise. But able to complete additional exercises with good mechanics. Fatigue 6/10 at end of session.  Patient will continue to benefit from physical therapy in order to improve function and reduce impairment.   OBJECTIVE IMPAIRMENTS: Abnormal gait, decreased activity tolerance, decreased balance, decreased endurance, decreased mobility, difficulty walking, decreased strength, impaired flexibility, and improper body mechanics.   ACTIVITY LIMITATIONS: carrying, lifting, bending, standing, squatting, stairs, transfers, locomotion level, and caring for others  PARTICIPATION LIMITATIONS: meal prep, cleaning, laundry, shopping, community activity, and yard work  PERSONAL FACTORS: Age, Time  since onset of injury/illness/exacerbation, and 1 comorbidity: HTN  are also affecting patient's functional outcome.   REHAB POTENTIAL: Good  CLINICAL DECISION MAKING: Stable/uncomplicated  EVALUATION COMPLEXITY: Low   GOALS: Goals reviewed with patient? Yes  SHORT TERM GOALS: Target date: 11/11/2022    Patient will be independent with HEP in order to improve functional outcomes. Baseline: Goal status: INITIAL  2.  Patient will report at least 25% improvement in symptoms for improved quality of life. Baseline: Goal status: INITIAL   LONG TERM GOALS: Target date: 11/25/2022    Patient will report at least 75% improvement in symptoms for improved quality of life. Baseline:  Goal status: INITIAL  2.   Patient will demonstrate grade of 4+/5 MMT grade in all tested musculature as evidence of improved strength to assist with stair ambulation and gait.   Baseline: see above Goal status: INITIAL  3.  Patient will be able to complete 5x STS in under 15 seconds in order to reduce the risk of falls. Baseline: 21.01 seconds without UE support Goal status: INITIAL  4.  Patient will be able to ambulate at least 350 feet in in order to demonstrate improved tolerance to activity. Baseline: 255 feet Goal status: INITIAL    PLAN:  PT FREQUENCY: 1x/week  PT DURATION: 4 weeks  PLANNED INTERVENTIONS: Therapeutic exercises, Therapeutic activity, Neuromuscular re-education, Balance training, Gait training, Patient/Family education, Joint manipulation, Joint mobilization, Stair training, Orthotic/Fit training, DME instructions, Aquatic Therapy, Dry Needling, Electrical stimulation, Spinal manipulation, Spinal mobilization, Cryotherapy, Moist heat, Compression bandaging, scar mobilization, Splintting, Taping, Traction, Ultrasound, Ionotophoresis 4mg /ml Dexamethasone, and Manual therapy  PLAN FOR NEXT SESSION: hip strength, functional strength, balance training, build HEP   Reola Mosher  Janille Draughon, PT 11/11/2022, 11:16 AM

## 2022-11-18 ENCOUNTER — Ambulatory Visit (HOSPITAL_COMMUNITY): Payer: No Typology Code available for payment source | Admitting: Physical Therapy

## 2022-11-18 ENCOUNTER — Encounter (HOSPITAL_COMMUNITY): Payer: Medicare Other | Admitting: Physical Therapy

## 2022-11-18 ENCOUNTER — Encounter (HOSPITAL_COMMUNITY): Payer: Self-pay | Admitting: Physical Therapy

## 2022-11-18 DIAGNOSIS — M6281 Muscle weakness (generalized): Secondary | ICD-10-CM

## 2022-11-18 DIAGNOSIS — R2689 Other abnormalities of gait and mobility: Secondary | ICD-10-CM

## 2022-11-18 NOTE — Therapy (Signed)
OUTPATIENT PHYSICAL THERAPY TREATMENT   Patient Name: Kyle Mccullough MRN: [id number] DOB:[birth year], 89 y.o., male Today's Date: 11/18/2022  PHYSICAL THERAPY DISCHARGE SUMMARY  Visits from Start of Care: 3  Current functional level related to goals / functional outcomes: See below   Remaining deficits: See below   Education / Equipment: See below   Patient agrees to discharge. Patient goals were partially met. Patient is being discharged due to being pleased with the current functional level.   END OF SESSION:  PT End of Session - 11/18/22 0947     Visit Number 3    Number of Visits 4    Date for PT Re-Evaluation 11/25/22    Authorization Type Primary: [facility] Secondary: medicare    Progress Note Due on Visit 10    PT Start Time 0945    PT Stop Time 1025    PT Time Calculation (min) 40 min    Activity Tolerance Patient tolerated treatment well    Behavior During Therapy WFL for tasks assessed/performed             Past Medical History:  Diagnosis Date   Hypertension    Past Surgical History:  Procedure Laterality Date   NECK SURGERY     RADIOACTIVE SEED IMPLANT     Patient Active Problem List   Diagnosis Date Noted   Left shoulder pain 05/08/2013   Stiffness of left shoulder joint 05/08/2013    [facility]  REFERRING PROVIDER: Rebeca Allegra, MD  REFERRING DIAG: R26.89 (ICD-10-CM) - Other abnormalities of gait and mobility   THERAPY DIAG:  Other abnormalities of gait and mobility  Muscle weakness (generalized)  Rationale for Evaluation and Treatment: Rehabilitation  ONSET DATE: about 1 year  SUBJECTIVE:   SUBJECTIVE STATEMENT: Patient states he has been doing HEP.Feels that we are on a good track. 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 Patient states 55-60% improvement since beginning PT.  EVAL: Patient states numbness in both lateral legs. Feet get cold. Sometimes has trouble with walking and balance because of it. Does have problems with back but its not  bothering him as much as he used to. Has been using pedaling machine has been helping at home.   PERTINENT HISTORY: HTN, DM PAIN:  Are you having pain? No  PRECAUTIONS: None  WEIGHT BEARING RESTRICTIONS: No  FALLS:  Has patient fallen in last 6 months? No  OCCUPATION: Retired  PLOF: Independent  PATIENT GOALS: improve sensation   OBJECTIVE:   COGNITION:Overall cognitive status: Within functional limits for tasks assessed     SENSATION: WFL Lateral LE feeling numb somewhat   POSTURE: No Significant postural limitations  PALPATION: TTP R proximal glute max   LUMBAR AROM: WFL all planes  LOWER EXTREMITY ROM: decreased R hip abduction and extension  Active ROM Right eval Left eval  Hip flexion    Hip extension    Hip abduction    Hip adduction    Hip internal rotation    Hip external rotation    Knee flexion    Knee extension    Ankle dorsiflexion    Ankle plantarflexion    Ankle inversion    Ankle eversion     (Blank rows = not tested)  LOWER EXTREMITY MMT:  MMT Right eval Left eval Right 11/18/22 Left 11/18/22  Hip flexion 5 5 5 5   Hip extension 3- 3 3+ 4-  Hip abduction 3- 4+ 4 4+  Hip adduction      Hip internal rotation  Hip external rotation      Knee flexion 5 5 5 5   Knee extension 5 5 5 5   Ankle dorsiflexion 4+ 4+ 4+ 4+  Ankle plantarflexion      Ankle inversion      Ankle eversion       (Blank rows = not tested)   FUNCTIONAL TESTS:  5 times sit to stand: 21.01 seconds without UE support 2 minute walk test: 255 feet  GAIT: Distance walked:  255 feet Assistive device utilized: None Level of assistance: Modified independence Comments: , slightly slow, antalgic on R hip slightly, mild/mod unsteadiness/sway   Reassessment 11/18/22 FUNCTIONAL TESTS:  5 times sit to stand: 12.52 seconds without UE support 2 minute walk test: 315 feet; , slightly slow, antalgic on R hip slightly, mild/mod unsteadiness/sway  TODAY'S  TREATMENT:                                                                                                                              DATE:  11/18/22 Reassessment Review of HEP and exercise progressions Self STM with tennis ball  11/11/22 Standing hip abduction with GTB at knees 1 x 10 bilateral  Standing hip extension with GTB at knees 1 x 10 bilateral   Standing HR 2x 10 Standing TR 2 x 10 Lateral step up 2 x 10 bilateral  LAQ 10 x 5 second holds bilateral  STS 1 x 10    10/28/22 Standing hip abduction 1 x 10 bilateral  Standing hip extension 1 x 10 bilateral     PATIENT EDUCATION:  Education details: 11/18/22: reassessment findings, HEP, returning to PT if needed; 7/19: HEP; EVAL: Patient educated on exam findings, POC, scope of PT, HEP. Person educated: Patient Education method: Explanation, Demonstration, and Handouts Education comprehension: verbalized understanding, returned demonstration, verbal cues required, and tactile cues required  HOME EXERCISE PROGRAM: Access Code: [email or web address] URL: [email or web address] 11/11/22 - Standing Hip Abduction with Resistance at Ankles and Counter Support  - 1 x daily - 7 x weekly - 2 sets - 10 reps - Standing Hip Extension with Resistance at Ankles and Counter Support  - 1 x daily - 7 x weekly - 2 sets - 10 reps - Heel Raises with Counter Support  - 1 x daily - 7 x weekly - 2 sets - 10 reps - Toe Raises with Counter Support  - 1 x daily - 7 x weekly - 2 sets - 10 reps - Lateral Step Up  - 1 x daily - 7 x weekly - 2 sets - 10 reps - Seated Long Arc Quad  - 1 x daily - 7 x weekly - 2 sets - 10 reps - Sit to Stand with Arms Crossed  - 1 x daily - 7 x weekly - 2 sets - 10 reps   Date: 10/28/2022 - Standing Hip Abduction with Counter Support  - 2-3 x daily - 7 x weekly - 2 sets - 10 reps -  Standing Hip Extension with Counter Support  - 2-3 x daily - 7 x weekly - 2 sets - 10 reps  ASSESSMENT:  CLINICAL IMPRESSION: Patient has  met 2/2 short term goals and 1/4 long term goals with ability to complete HEP and improvement in symptoms, strength, ROM, activity tolerance, gait, balance, and functional mobility. Remaining goals not met due to continued deficits in strength, symptoms, and gait. Patient has made good progress toward remaining goals. Patient feels ready to transition to HEP for self management of symptoms. Patient tender at right glutes, patient educated on and performs self STM to glutes which is helpful to symptoms. Patient discharged from PT at this time.   OBJECTIVE IMPAIRMENTS: Abnormal gait, decreased activity tolerance, decreased balance, decreased endurance, decreased mobility, difficulty walking, decreased strength, impaired flexibility, and improper body mechanics.   ACTIVITY LIMITATIONS: carrying, lifting, bending, standing, squatting, stairs, transfers, locomotion level, and caring for others  PARTICIPATION LIMITATIONS: meal prep, cleaning, laundry, shopping, community activity, and yard work  PERSONAL FACTORS: Age, Time since onset of injury/illness/exacerbation, and 1 comorbidity: HTN  are also affecting patient's functional outcome.   REHAB POTENTIAL: Good  CLINICAL DECISION MAKING: Stable/uncomplicated  EVALUATION COMPLEXITY: Low   GOALS: Goals reviewed with patient? Yes  SHORT TERM GOALS: Target date: 11/11/2022    Patient will be independent with HEP in order to improve functional outcomes. Baseline: Goal status: MET  2.  Patient will report at least 25% improvement in symptoms for improved quality of life. Baseline: Goal status: MET   LONG TERM GOALS: Target date: 11/25/2022    Patient will report at least 75% improvement in symptoms for improved quality of life. Baseline:  Goal status: NOT MET  2.   Patient will demonstrate grade of 4+/5 MMT grade in all tested musculature as evidence of improved strength to assist with stair ambulation and gait.   Baseline: see above Goal  status: NOT MET  3.  Patient will be able to complete 5x STS in under 15 seconds in order to reduce the risk of falls. Baseline: 21.01 seconds without UE support Goal status: MET  4.  Patient will be able to ambulate at least 350 feet in in order to demonstrate improved tolerance to activity. Baseline: 255 feet 11/18/22: 315 feet; , slightly slow, antalgic on R hip slightly, mild/mod unsteadiness/sway  Goal status: MET    PLAN:  PT FREQUENCY: 1x/week  PT DURATION: 4 weeks  PLANNED INTERVENTIONS: Therapeutic exercises, Therapeutic activity, Neuromuscular re-education, Balance training, Gait training, Patient/Family education, Joint manipulation, Joint mobilization, Stair training, Orthotic/Fit training, DME instructions, Aquatic Therapy, Dry Needling, Electrical stimulation, Spinal manipulation, Spinal mobilization, Cryotherapy, Moist heat, Compression bandaging, scar mobilization, Splintting, Taping, Traction, Ultrasound, Ionotophoresis 4mg /ml Dexamethasone, and Manual therapy  PLAN FOR NEXT SESSION: Gloriajean Dell Adlai Nieblas, PT 11/18/2022, 9:48 AM

## 2022-11-28 ENCOUNTER — Encounter (HOSPITAL_COMMUNITY): Payer: Medicare Other | Admitting: Physical Therapy

## 2023-04-22 ENCOUNTER — Other Ambulatory Visit: Payer: Self-pay

## 2023-04-22 ENCOUNTER — Emergency Department (HOSPITAL_COMMUNITY)
Admission: EM | Admit: 2023-04-22 | Discharge: 2023-04-22 | Disposition: A | Discharge status: Home / Self Care | Payer: Medicare Other | Attending: Emergency Medicine | Admitting: Emergency Medicine

## 2023-04-22 ENCOUNTER — Encounter (HOSPITAL_COMMUNITY): Payer: Self-pay

## 2023-04-22 DIAGNOSIS — U071 COVID-19: Secondary | ICD-10-CM | POA: Diagnosis not present

## 2023-04-22 DIAGNOSIS — M791 Myalgia, unspecified site: Secondary | ICD-10-CM | POA: Diagnosis present

## 2023-04-22 LAB — RESP PANEL BY RT-PCR (RSV, FLU A&B, COVID)  RVPGX2
Influenza A by PCR: NEGATIVE
Influenza B by PCR: NEGATIVE
Resp Syncytial Virus by PCR: NEGATIVE
SARS Coronavirus 2 by RT PCR: POSITIVE — AB

## 2023-04-22 LAB — BASIC METABOLIC PANEL
Anion gap: 10 (ref 5–15)
BUN: 16 mg/dL (ref 8–23)
CO2: 24 mmol/L (ref 22–32)
Calcium: 9.4 mg/dL (ref 8.9–10.3)
Chloride: 100 mmol/L (ref 98–111)
Creatinine, Ser: 1.47 mg/dL — ABNORMAL HIGH (ref 0.61–1.24)
GFR, Estimated: 46 mL/min — ABNORMAL LOW (ref >60)
Glucose, Bld: 147 mg/dL — ABNORMAL HIGH (ref 70–99)
Potassium: 4.5 mmol/L (ref 3.5–5.1)
Sodium: 134 mmol/L — ABNORMAL LOW (ref 135–145)

## 2023-04-22 MED ORDER — PAXLOVID (150/100) 10 X 150 MG & 10 X 100MG PO TBPK: 2.0000 | ORAL_TABLET | Freq: Two times a day (BID) | ORAL | 0 refills | Status: AC

## 2023-04-22 MED ORDER — ACETAMINOPHEN 325 MG PO TABS
650.0000 mg | ORAL_TABLET | Freq: Once | ORAL | Status: AC | PRN
  Administered 2023-04-22: 650 mg via ORAL
  Filled 2023-04-22: qty 2

## 2023-04-22 NOTE — ED Provider Notes (Signed)
[place] EMERGENCY DEPARTMENT AT [facility] Provider Note   CSN: [id number] Arrival date & time: 04/22/23  1324     History {Add pertinent medical, surgical, social history, OB history to HPI:1} Chief Complaint  Patient presents with   Generalized Body Aches    Kyle Mccullough is a 89 y.o. male.  89 year old male with history of hypertension, GERD, and hyperlipidemia who presents to the emergency department with cough, fevers, and generalized bodyaches.  Symptoms started yesterday.  Unsure if he has had any sick contacts.  No significant shortness of breath.  No chest pain.  Takes pravastatin for his cholesterol.       Home Medications Prior to Admission medications   Medication Sig Start Date End Date Taking? Authorizing Provider  amoxicillin (AMOXIL) 500 MG capsule Take 2 capsules (1,000 mg total) by mouth 3 (three) times daily. 08/30/13   Dione Booze, MD  azithromycin (ZITHROMAX) 250 MG tablet Take 1 every day until finished. 03/01/18   Jacalyn Lefevre, MD  erythromycin ophthalmic ointment Place a 1/2 inch ribbon of ointment into the lower eyelid. 11/17/20   Henderly, Britni A, PA-C      Allergies    Patient has no known allergies.    Review of Systems   Review of Systems  Physical Exam Updated Vital Signs BP (!) 153/91 (BP Location: Right Arm)   Pulse 88   Temp (!) 101.6 F (38.7 C) (Oral)   Resp 18   Ht 5\' 10"  (1.778 m)   Wt 70.8 kg   SpO2 96%   BMI 22.38 kg/m  Physical Exam Vitals and nursing note reviewed.  Constitutional:      General: He is not in acute distress.    Appearance: He is well-developed.  HENT:     Head: Normocephalic and atraumatic.     Right Ear: External ear normal.     Left Ear: External ear normal.     Nose: Nose normal.  Eyes:     Extraocular Movements: Extraocular movements intact.     Conjunctiva/sclera: Conjunctivae normal.     Pupils: Pupils are equal, round, and reactive to light.  Cardiovascular:     Rate and  Rhythm: Normal rate and regular rhythm.     Heart sounds: Normal heart sounds.  Pulmonary:     Effort: Pulmonary effort is normal. No respiratory distress.     Breath sounds: Normal breath sounds.  Musculoskeletal:     Cervical back: Normal range of motion and neck supple.  Skin:    General: Skin is warm and dry.  Neurological:     Mental Status: He is alert. Mental status is at baseline.  Psychiatric:        Mood and Affect: Mood normal.        Behavior: Behavior normal.     ED Results / Procedures / Treatments   Labs (all labs ordered are listed, but only abnormal results are displayed) Labs Reviewed  RESP PANEL BY RT-PCR (RSV, FLU A&B, COVID)  RVPGX2 - Abnormal; Notable for the following components:      Result Value   SARS Coronavirus 2 by RT PCR POSITIVE (*)    All other components within normal limits  BASIC METABOLIC PANEL    EKG None  Radiology No results found.  Procedures Procedures  {Document cardiac monitor, telemetry assessment procedure when appropriate:1}  Medications Ordered in ED Medications  acetaminophen (TYLENOL) tablet 650 mg (650 mg Oral Given 04/22/23 1402)    ED Course/ Medical  Decision Making/ A&P   {   Click here for ABCD2, HEART and other calculatorsREFRESH Note before signing :1}                              Medical Decision Making Amount and/or Complexity of Data Reviewed Labs: ordered.  Risk OTC drugs.   ***  {Document critical care time when appropriate:1} {Document review of labs and clinical decision tools ie heart score, Chads2Vasc2 etc:1}  {Document your independent review of radiology images, and any outside records:1} {Document your discussion with family members, caretakers, and with consultants:1} {Document social determinants of health affecting pt's care:1} {Document your decision making why or why not admission, treatments were needed:1} Final Clinical Impression(s) / ED Diagnoses Final diagnoses:  COVID-19     Rx / DC Orders ED Discharge Orders     None

## 2023-04-22 NOTE — Discharge Instructions (Addendum)
You were seen for your COVID infection in the emergency department.   At home, please use Tylenol for your muscle aches and fevers.  Please use over-the-counter cough medication or tea with honey for your cough.  Take the Paxlovid we have prescribed you to treat your COVID infection.  Do not take the pravastatin while you are on this medication.  You may resume the pravastatin 3 days after you finish the Paxlovid.  Follow-up with your primary doctor in 1 week regarding your visit.  This may be over the phone if you are feeling better or in person if you are feeling worse.  Return immediately to the emergency department if you experience any of the following: Difficulty breathing, or any other concerning symptoms.    Thank you for visiting our Emergency Department. It was a pleasure taking care of you today.

## 2023-04-22 NOTE — ED Triage Notes (Signed)
Pt states that he feels sore all over and wants to be tested for covid. Denies any sick contacts. Temp in triage 101.6
